# Patient Record
Sex: Female | Born: 1975
Health system: Southern US, Community
[De-identification: ages and names within clinical notes are randomized; demographics above are authoritative.]

## PROBLEM LIST (undated history)

## (undated) DIAGNOSIS — F419 Anxiety disorder, unspecified: Secondary | ICD-10-CM

## (undated) DIAGNOSIS — I10 Essential (primary) hypertension: Secondary | ICD-10-CM

## (undated) HISTORY — DX: Essential (primary) hypertension: I10

## (undated) HISTORY — DX: Anxiety disorder, unspecified: F41.9

## (undated) HISTORY — PX: LAPAROSCOPIC GASTRIC BANDING: SHX1100

---

## 2011-03-26 ENCOUNTER — Ambulatory Visit (INDEPENDENT_AMBULATORY_CARE_PROVIDER_SITE_OTHER): Payer: BC Managed Care – PPO

## 2011-03-26 DIAGNOSIS — R509 Fever, unspecified: Secondary | ICD-10-CM

## 2011-03-26 DIAGNOSIS — E669 Obesity, unspecified: Secondary | ICD-10-CM

## 2011-03-26 DIAGNOSIS — R05 Cough: Secondary | ICD-10-CM

## 2011-03-26 DIAGNOSIS — I10 Essential (primary) hypertension: Secondary | ICD-10-CM

## 2011-03-26 DIAGNOSIS — J9801 Acute bronchospasm: Secondary | ICD-10-CM

## 2011-11-29 ENCOUNTER — Other Ambulatory Visit: Payer: Self-pay | Admitting: *Deleted

## 2011-11-29 MED ORDER — HYDROCHLOROTHIAZIDE 25 MG PO TABS: 25.0000 mg | ORAL_TABLET | Freq: Every day | ORAL | Status: DC

## 2013-03-25 ENCOUNTER — Ambulatory Visit (INDEPENDENT_AMBULATORY_CARE_PROVIDER_SITE_OTHER): Payer: BC Managed Care – PPO | Admitting: Family Medicine

## 2013-03-25 VITALS — BP 130/91 | HR 82 | Temp 98.8°F | Resp 18 | Ht 65.0 in | Wt 222.8 lb

## 2013-03-25 DIAGNOSIS — F411 Generalized anxiety disorder: Secondary | ICD-10-CM

## 2013-03-25 DIAGNOSIS — F418 Other specified anxiety disorders: Secondary | ICD-10-CM

## 2013-03-25 DIAGNOSIS — I10 Essential (primary) hypertension: Secondary | ICD-10-CM

## 2013-03-25 DIAGNOSIS — R002 Palpitations: Secondary | ICD-10-CM

## 2013-03-25 DIAGNOSIS — R42 Dizziness and giddiness: Secondary | ICD-10-CM

## 2013-03-25 LAB — POCT CBC
Lymph, poc: 2.6 (ref 0.6–3.4)
MCHC: 29.9 g/dL — AB (ref 31.8–35.4)
MPV: 8.4 fL (ref 0–99.8)
POC Granulocyte: 3 (ref 2–6.9)
POC LYMPH PERCENT: 43.9 %L (ref 10–50)
POC MID %: 6 %M (ref 0–12)
RDW, POC: 13.9 %

## 2013-03-25 MED ORDER — HYDROCHLOROTHIAZIDE 12.5 MG PO CAPS: 12.5000 mg | ORAL_CAPSULE | Freq: Every day | ORAL | Status: DC

## 2013-03-25 MED ORDER — CLONAZEPAM 0.5 MG PO TABS: 0.5000 mg | ORAL_TABLET | Freq: Two times a day (BID) | ORAL | Status: DC | PRN

## 2013-03-25 NOTE — Progress Notes (Addendum)
Subjective:    Patient ID: Sandy [name], female    DOB: 12-12-1975, 37 y.o.   MRN: [id number] This chart was scribed for Meredith Staggers, MD by Danella Maiers, ED Scribe. This patient was seen in room 13 and the patient's care was started at 5:55 PM.  Chief Complaint  Patient presents with  . Dizziness    3 days  . Tachycardia  . Emesis    once    HPI HPI Comments: Sandy [name] is a 37 y.o. female who presents to the [facility] complaining of dizziness and racing heart for the last 3 days and one episode of emesis. She states she felt like she was having a panic attack but it has not stopped so she became concerned. She reports trouble sleeping and waking up in the middle of the night with racing heart and anxiety. She states her heart is beating faster, not harder. She reports some relief with putting ice packs on the back of her neck. She used to drink a lot of caffeine but has given up caffeine, tea, chocolate. She states she may not have been drinking enough fluids lately. She lives in [place] and she has some family health issues and her mother had a break-in, causing pt lots of recent stress. She denies SI, CP, SOB, LOC. She has felt stressed in the past but never as bad as recently. She denies h/o anxiety or depression. She also reports mild frontal headache and states it is probably due to chronic sinus infections. She states her dad died of heart disease at age 52 and had triple bypass at 6 but may have been having heart problems up to 10 years prior to death. He also had PVD but no MI. She states her mom has heart disease onset age 42 and an arhythmia.   She is also requesting a medication refill on HCTZ 25 mg per day for HTN. She takes her BP at home.  She states her numbers vary but have been in the 120s-130s and 80s-90s on and off of medication. She has been taking her medication only 3-4 times per week. - took in past for swelling as well.   LNMP  normal 03/08/13 - no unprotected intercourse since.   There are no active problems to display for this patient.  Past Medical History  Diagnosis Date  . Hypertension    Past Surgical History  Procedure Laterality Date  . Laparoscopic gastric banding     Allergies  Allergen Reactions  . Ace Inhibitors    Prior to Admission medications   Medication Sig Start Date End Date Taking? Authorizing Provider  hydrochlorothiazide (HYDRODIURIL) 25 MG tablet Take 1 tablet (25 mg total) by mouth daily. 11/29/11 11/28/12  Sondra Barges, PA-C   History  Substance Use Topics  . Smoking status: Never Smoker   . Smokeless tobacco: Not on file  . Alcohol Use: Yes     Review of Systems  Constitutional: Negative for fatigue and unexpected weight change.  Respiratory: Negative for chest tightness and shortness of breath.   Cardiovascular: Positive for palpitations. Negative for chest pain and leg swelling.  Gastrointestinal: Positive for vomiting. Negative for abdominal pain and blood in stool.  Neurological: Positive for dizziness and headaches. Negative for syncope and light-headedness.  Psychiatric/Behavioral: Negative for suicidal ideas.       Objective:   Physical Exam  Nursing note and vitals reviewed. Constitutional: She is oriented to person, place, and time. She appears  well-developed and well-nourished. No distress.  HENT:  Head: Normocephalic and atraumatic.  Eyes: Conjunctivae and EOM are normal. Pupils are equal, round, and reactive to light.  Neck: Neck supple. Carotid bruit is not present. No tracheal deviation present.  Cardiovascular: Normal rate, regular rhythm, normal heart sounds and intact distal pulses.   No extrasystoles are present.  Pulmonary/Chest: Effort normal and breath sounds normal. No respiratory distress.  Abdominal: Soft. She exhibits no pulsatile midline mass. There is no tenderness.  Musculoskeletal: Normal range of motion.  Neurological: She is alert and  oriented to person, place, and time.  Skin: Skin is warm and dry.  Psychiatric: She has a normal mood and affect. Her behavior is normal.     Filed Vitals:   03/25/13 1717  BP: 140/92  Pulse: 88  Temp: 98.8 F (37.1 C)  Resp: 18  Height: 5\' 5"  (1.651 m)  Weight: 222 lb 12.8 oz (101.061 kg)  SpO2: 99%   EKG: SR, no ectopy, no acute findings.   Results for orders placed in visit on 03/25/13  POCT CBC      Result Value Range   WBC 6.0  4.6 - 10.2 K/uL   Lymph, poc 2.6  0.6 - 3.4   POC LYMPH PERCENT 43.9  10 - 50 %L   MID (cbc) 0.4  0 - 0.9   POC MID % 6.0  0 - 12 %M   POC Granulocyte 3.0  2 - 6.9   Granulocyte percent 50.1  37 - 80 %G   RBC 4.64  4.04 - 5.48 M/uL   Hemoglobin 12.3  12.2 - 16.2 g/dL   HCT, POC 91.4  78.2 - 47.9 %   MCV 88.9  80 - 97 fL   MCH, POC 26.5 (*) 27 - 31.2 pg   MCHC 29.9 (*) 31.8 - 35.4 g/dL   RDW, POC 95.6     Platelet Count, POC 312  142 - 424 K/uL   MPV 8.4  0 - 99.8 fL  GLUCOSE, POCT (MANUAL RESULT ENTRY)      Result Value Range   POC Glucose 85  70 - 99 mg/dl       Assessment & Plan:   Sandy [name] is a 37 y.o. female Hypertension - Plan: EKG 12-Lead, Basic metabolic panel, hydrochlorothiazide (MICROZIDE) 12.5 MG capsule. Refilled HCTZ - can try 1/2 prior dose QD as intermittent dosing prior. rtc precautions. Plan on recheck in 6 months for CPE if controlled   Palpitations, Dizziness  - Plan: EKG 12-Lead, Basic metabolic panel, POCT glucose (manual entry),  Situational anxiety - Plan: clonazePAM (KLONOPIN) 0.5 MG tablet  - suspect stress/situational anxiety as primary cause but will check TSH and electrolytes as above. Stress and relaxation techniques discussed and Klonopin provided if needed if flare in symptoms. Return here or ER if problems worsen. Additionally recommended increased fluid intake to lessen as a contributor.    Meds ordered this encounter  Medications  . hydrochlorothiazide (MICROZIDE) 12.5 MG capsule    Sig:  Take 1 capsule (12.5 mg total) by mouth daily.    Dispense:  90 capsule    Refill:  1  . clonazePAM (KLONOPIN) 0.5 MG tablet    Sig: Take 1-2 tablets (0.5-1 mg total) by mouth 2 (two) times daily as needed for anxiety.    Dispense:  20 tablet    Refill:  1   Patient Instructions  Continue work on Brewing technologist, stress management techniques. Klonopin if needed - this can  cause sedation. You should receive a call or letter about your lab results within the next week to 10 days.  Restart hctz at 12.5mg  each day. Keep a record of your blood pressures outside of the office and bring them to the next office visit. Return to the clinic or go to the nearest emergency room if any of your symptoms worsen or new symptoms occur. Palpitations  A palpitation is the feeling that your heartbeat is irregular or is faster than normal. It may feel like your heart is fluttering or skipping a beat. Palpitations are usually not a serious problem. However, in some cases, you may need further medical evaluation. CAUSES  Palpitations can be caused by:  Smoking.  Caffeine or other stimulants, such as diet pills or energy drinks.  Alcohol.  Stress and anxiety.  Strenuous physical activity.  Fatigue.  Certain medicines.  Heart disease, especially if you have a history of arrhythmias. This includes atrial fibrillation, atrial flutter, or supraventricular tachycardia.  An improperly working pacemaker or defibrillator. DIAGNOSIS  To find the cause of your palpitations, your caregiver will take your history and perform a physical exam. Tests may also be done, including:  Electrocardiography (ECG). This test records the heart's electrical activity.  Cardiac monitoring. This allows your caregiver to monitor your heart rate and rhythm in real time.  Holter monitor. This is a portable device that records your heartbeat and can help diagnose heart arrhythmias. It allows your caregiver to track your heart  activity for several days, if needed.  Stress tests by exercise or by giving medicine that makes the heart beat faster. TREATMENT  Treatment of palpitations depends on the cause of your symptoms and can vary greatly. Most cases of palpitations do not require any treatment other than time, relaxation, and monitoring your symptoms. Other causes, such as atrial fibrillation, atrial flutter, or supraventricular tachycardia, usually require further treatment. HOME CARE INSTRUCTIONS   Avoid:  Caffeinated coffee, tea, soft drinks, diet pills, and energy drinks.  Chocolate.  Alcohol.  Stop smoking if you smoke.  Reduce your stress and anxiety. Things that can help you relax include:  A method that measures bodily functions so you can learn to control them (biofeedback).  Yoga.  Meditation.  Physical activity such as swimming, jogging, or walking.  Get plenty of rest and sleep. SEEK MEDICAL CARE IF:   You continue to have a fast or irregular heartbeat beyond 24 hours.  Your palpitations occur more often. SEEK IMMEDIATE MEDICAL CARE IF:  You develop chest pain or shortness of breath.  You have a severe headache.  You feel dizzy, or you faint. MAKE SURE YOU:  Understand these instructions.  Will watch your condition.  Will get help right away if you are not doing well or get worse. Document Released: 03/31/2000 Document Revised: 07/29/2012 Document Reviewed: 06/02/2011 [facility] Patient Information 2014 [place], [place].   I personally performed the services described in this documentation, which was scribed in my presence. The recorded information has been reviewed and considered, and addended by me as needed.

## 2013-03-25 NOTE — Patient Instructions (Signed)
Continue work on Brewing technologist, Medical illustrator. Klonopin if needed - this can cause sedation. You should receive a call or letter about your lab results within the next week to 10 days.  Restart hctz at 12.5mg  each day. Keep a record of your blood pressures outside of the office and bring them to the next office visit. Return to the clinic or go to the nearest emergency room if any of your symptoms worsen or new symptoms occur. Palpitations  A palpitation is the feeling that your heartbeat is irregular or is faster than normal. It may feel like your heart is fluttering or skipping a beat. Palpitations are usually not a serious problem. However, in some cases, you may need further medical evaluation. CAUSES  Palpitations can be caused by:  Smoking.  Caffeine or other stimulants, such as diet pills or energy drinks.  Alcohol.  Stress and anxiety.  Strenuous physical activity.  Fatigue.  Certain medicines.  Heart disease, especially if you have a history of arrhythmias. This includes atrial fibrillation, atrial flutter, or supraventricular tachycardia.  An improperly working pacemaker or defibrillator. DIAGNOSIS  To find the cause of your palpitations, your caregiver will take your history and perform a physical exam. Tests may also be done, including:  Electrocardiography (ECG). This test records the heart's electrical activity.  Cardiac monitoring. This allows your caregiver to monitor your heart rate and rhythm in real time.  Holter monitor. This is a portable device that records your heartbeat and can help diagnose heart arrhythmias. It allows your caregiver to track your heart activity for several days, if needed.  Stress tests by exercise or by giving medicine that makes the heart beat faster. TREATMENT  Treatment of palpitations depends on the cause of your symptoms and can vary greatly. Most cases of palpitations do not require any treatment other than  time, relaxation, and monitoring your symptoms. Other causes, such as atrial fibrillation, atrial flutter, or supraventricular tachycardia, usually require further treatment. HOME CARE INSTRUCTIONS   Avoid:  Caffeinated coffee, tea, soft drinks, diet pills, and energy drinks.  Chocolate.  Alcohol.  Stop smoking if you smoke.  Reduce your stress and anxiety. Things that can help you relax include:  A method that measures bodily functions so you can learn to control them (biofeedback).  Yoga.  Meditation.  Physical activity such as swimming, jogging, or walking.  Get plenty of rest and sleep. SEEK MEDICAL CARE IF:   You continue to have a fast or irregular heartbeat beyond 24 hours.  Your palpitations occur more often. SEEK IMMEDIATE MEDICAL CARE IF:  You develop chest pain or shortness of breath.  You have a severe headache.  You feel dizzy, or you faint. MAKE SURE YOU:  Understand these instructions.  Will watch your condition.  Will get help right away if you are not doing well or get worse. Document Released: 03/31/2000 Document Revised: 07/29/2012 Document Reviewed: 06/02/2011 [facility] Patient Information 2014 [place], [place].

## 2013-03-26 LAB — TSH: TSH: 1.575 u[IU]/mL (ref 0.350–4.500)

## 2013-03-26 LAB — BASIC METABOLIC PANEL
Calcium: 9.2 mg/dL (ref 8.4–10.5)
Glucose, Bld: 82 mg/dL (ref 70–99)
Sodium: 133 mEq/L — ABNORMAL LOW (ref 135–145)

## 2013-04-01 NOTE — Progress Notes (Signed)
Left message for patient to call back regarding scheduling physical with dr Neva Seat in six month.

## 2013-04-26 ENCOUNTER — Emergency Department (HOSPITAL_COMMUNITY)
Admission: EM | Admit: 2013-04-26 | Discharge: 2013-04-26 | Disposition: A | Discharge status: Home / Self Care | Payer: BC Managed Care – PPO | Attending: Emergency Medicine | Admitting: Emergency Medicine

## 2013-04-26 ENCOUNTER — Encounter (HOSPITAL_COMMUNITY): Payer: Self-pay | Admitting: Emergency Medicine

## 2013-04-26 ENCOUNTER — Emergency Department (HOSPITAL_COMMUNITY): Payer: BC Managed Care – PPO

## 2013-04-26 DIAGNOSIS — Z3202 Encounter for pregnancy test, result negative: Secondary | ICD-10-CM | POA: Insufficient documentation

## 2013-04-26 DIAGNOSIS — R143 Flatulence: Secondary | ICD-10-CM

## 2013-04-26 DIAGNOSIS — Z79899 Other long term (current) drug therapy: Secondary | ICD-10-CM | POA: Insufficient documentation

## 2013-04-26 DIAGNOSIS — R110 Nausea: Secondary | ICD-10-CM | POA: Insufficient documentation

## 2013-04-26 DIAGNOSIS — R142 Eructation: Secondary | ICD-10-CM | POA: Insufficient documentation

## 2013-04-26 DIAGNOSIS — Z9884 Bariatric surgery status: Secondary | ICD-10-CM | POA: Insufficient documentation

## 2013-04-26 DIAGNOSIS — R141 Gas pain: Secondary | ICD-10-CM | POA: Insufficient documentation

## 2013-04-26 DIAGNOSIS — K5900 Constipation, unspecified: Secondary | ICD-10-CM | POA: Insufficient documentation

## 2013-04-26 DIAGNOSIS — R109 Unspecified abdominal pain: Secondary | ICD-10-CM | POA: Insufficient documentation

## 2013-04-26 DIAGNOSIS — I10 Essential (primary) hypertension: Secondary | ICD-10-CM | POA: Insufficient documentation

## 2013-04-26 LAB — URINALYSIS, ROUTINE W REFLEX MICROSCOPIC
Bilirubin Urine: NEGATIVE
Glucose, UA: NEGATIVE mg/dL
Hgb urine dipstick: NEGATIVE
Ketones, ur: NEGATIVE mg/dL
Leukocytes, UA: NEGATIVE
Nitrite: NEGATIVE
Protein, ur: NEGATIVE mg/dL
Specific Gravity, Urine: 1.005 (ref 1.005–1.030)
Urobilinogen, UA: 0.2 mg/dL (ref 0.0–1.0)
pH: 6 (ref 5.0–8.0)

## 2013-04-26 LAB — CBC WITH DIFFERENTIAL/PLATELET
Basophils Absolute: 0 10*3/uL (ref 0.0–0.1)
Basophils Relative: 0 % (ref 0–1)
Eosinophils Absolute: 0.1 10*3/uL (ref 0.0–0.7)
Eosinophils Relative: 2 % (ref 0–5)
HCT: 40 % (ref 36.0–46.0)
Hemoglobin: 13.2 g/dL (ref 12.0–15.0)
Lymphocytes Relative: 32 % (ref 12–46)
Lymphs Abs: 1.6 10*3/uL (ref 0.7–4.0)
MCH: 27.7 pg (ref 26.0–34.0)
MCHC: 33 g/dL (ref 30.0–36.0)
MCV: 83.9 fL (ref 78.0–100.0)
Monocytes Absolute: 0.4 10*3/uL (ref 0.1–1.0)
Monocytes Relative: 9 % (ref 3–12)
Neutro Abs: 2.8 10*3/uL (ref 1.7–7.7)
Neutrophils Relative %: 57 % (ref 43–77)
Platelets: 323 10*3/uL (ref 150–400)
RBC: 4.77 MIL/uL (ref 3.87–5.11)
RDW: 12.9 % (ref 11.5–15.5)
WBC: 4.9 10*3/uL (ref 4.0–10.5)

## 2013-04-26 LAB — COMPREHENSIVE METABOLIC PANEL
ALT: 15 U/L (ref 0–35)
AST: 20 U/L (ref 0–37)
Albumin: 4 g/dL (ref 3.5–5.2)
Alkaline Phosphatase: 68 U/L (ref 39–117)
BILIRUBIN TOTAL: 1.4 mg/dL — AB (ref 0.3–1.2)
BUN: 9 mg/dL (ref 6–23)
CO2: 24 meq/L (ref 19–32)
CREATININE: 0.8 mg/dL (ref 0.50–1.10)
Calcium: 9.3 mg/dL (ref 8.4–10.5)
Chloride: 96 mEq/L (ref 96–112)
GFR calc Af Amer: >90 mL/min (ref >90)
Glucose, Bld: 98 mg/dL (ref 70–99)
Potassium: 3.8 mEq/L (ref 3.7–5.3)
Sodium: 134 mEq/L — ABNORMAL LOW (ref 137–147)
Total Protein: 7.5 g/dL (ref 6.0–8.3)

## 2013-04-26 LAB — LIPASE, BLOOD: Lipase: 28 U/L (ref 11–59)

## 2013-04-26 LAB — POCT PREGNANCY, URINE: Preg Test, Ur: NEGATIVE

## 2013-04-26 MED ORDER — PANTOPRAZOLE SODIUM 20 MG PO TBEC: 20.0000 mg | DELAYED_RELEASE_TABLET | Freq: Every day | ORAL | Status: DC

## 2013-04-26 MED ORDER — MORPHINE SULFATE 4 MG/ML IJ SOLN
4.0000 mg | Freq: Once | INTRAMUSCULAR | Status: AC
  Administered 2013-04-26: 4 mg via INTRAVENOUS
  Filled 2013-04-26: qty 1

## 2013-04-26 NOTE — ED Notes (Signed)
Bed: WA01 Expected date:  Expected time:  Means of arrival:  Comments: Closed until 11

## 2013-04-26 NOTE — Discharge Instructions (Signed)
Abdominal Pain, Women °Abdominal (stomach, pelvic, or belly) pain can be caused by many things. It is important to tell your doctor: °· The location of the pain. °· Does it come and go or is it present all the time? °· Are there things that start the pain (eating certain foods, exercise)? °· Are there other symptoms associated with the pain (fever, nausea, vomiting, diarrhea)? °All of this is helpful to know when trying to find the cause of the pain. °CAUSES  °· Stomach: virus or bacteria infection, or ulcer. °· Intestine: appendicitis (inflamed appendix), regional ileitis (Crohn's disease), ulcerative colitis (inflamed colon), irritable bowel syndrome, diverticulitis (inflamed diverticulum of the colon), or cancer of the stomach or intestine. °· Gallbladder disease or stones in the gallbladder. °· Kidney disease, kidney stones, or infection. °· Pancreas infection or cancer. °· Fibromyalgia (pain disorder). °· Diseases of the female organs: °· Uterus: fibroid (non-cancerous) tumors or infection. °· Fallopian tubes: infection or tubal pregnancy. °· Ovary: cysts or tumors. °· Pelvic adhesions (scar tissue). °· Endometriosis (uterus lining tissue growing in the pelvis and on the pelvic organs). °· Pelvic congestion syndrome (female organs filling up with blood just before the menstrual period). °· Pain with the menstrual period. °· Pain with ovulation (producing an egg). °· Pain with an IUD (intrauterine device, birth control) in the uterus. °· Cancer of the female organs. °· Functional pain (pain not caused by a disease, may improve without treatment). °· Psychological pain. °· Depression. °DIAGNOSIS  °Your doctor will decide the seriousness of your pain by doing an examination. °· Blood tests. °· X-rays. °· Ultrasound. °· CT scan (computed tomography, special type of X-ray). °· MRI (magnetic resonance imaging). °· Cultures, for infection. °· Barium enema (dye inserted in the large intestine, to better view it with  X-rays). °· Colonoscopy (looking in intestine with a lighted tube). °· Laparoscopy (minor surgery, looking in abdomen with a lighted tube). °· Major abdominal exploratory surgery (looking in abdomen with a large incision). °TREATMENT  °The treatment will depend on the cause of the pain.  °· Many cases can be observed and treated at home. °· Over-the-counter medicines recommended by your caregiver. °· Prescription medicine. °· Antibiotics, for infection. °· Birth control pills, for painful periods or for ovulation pain. °· Hormone treatment, for endometriosis. °· Nerve blocking injections. °· Physical therapy. °· Antidepressants. °· Counseling with a psychologist or psychiatrist. °· Minor or major surgery. °HOME CARE INSTRUCTIONS  °· Do not take laxatives, unless directed by your caregiver. °· Take over-the-counter pain medicine only if ordered by your caregiver. Do not take aspirin because it can cause an upset stomach or bleeding. °· Try a clear liquid diet (broth or water) as ordered by your caregiver. Slowly move to a bland diet, as tolerated, if the pain is related to the stomach or intestine. °· Have a thermometer and take your temperature several times a day, and record it. °· Bed rest and sleep, if it helps the pain. °· Avoid sexual intercourse, if it causes pain. °· Avoid stressful situations. °· Keep your follow-up appointments and tests, as your caregiver orders. °· If the pain does not go away with medicine or surgery, you may try: °· Acupuncture. °· Relaxation exercises (yoga, meditation). °· Group therapy. °· Counseling. °SEEK MEDICAL CARE IF:  °· You notice certain foods cause stomach pain. °· Your home care treatment is not helping your pain. °· You need stronger pain medicine. °· You want your IUD removed. °· You feel faint or   lightheaded. °· You develop nausea and vomiting. °· You develop a rash. °· You are having side effects or an allergy to your medicine. °SEEK IMMEDIATE MEDICAL CARE IF:  °· Your  pain does not go away or gets worse. °· You have a fever. °· Your pain is felt only in portions of the abdomen. The right side could possibly be appendicitis. The left lower portion of the abdomen could be colitis or diverticulitis. °· You are passing blood in your stools (bright red or black tarry stools, with or without vomiting). °· You have blood in your urine. °· You develop chills, with or without a fever. °· You pass out. °MAKE SURE YOU:  °· Understand these instructions. °· Will watch your condition. °· Will get help right away if you are not doing well or get worse. °Document Released: 01/29/2007 Document Revised: 06/26/2011 Document Reviewed: 02/18/2009 °[employer]® Patient Information ©2014 [employer]. ° °

## 2013-04-26 NOTE — ED Notes (Signed)
States that she began having abd pain and right side pain this am. States that her last BM was 3 days ago and took a laxative with result this am. States that she still has abd discomfort. Has lapband

## 2013-04-26 NOTE — ED Provider Notes (Signed)
CSN: [id number]     Arrival date & time 04/26/13  0944 History   First MD Initiated Contact with Patient 04/26/13 1139     Chief Complaint  Patient presents with  . Abdominal Pain  . Constipation   (Consider location/radiation/quality/duration/timing/severity/associated sxs/prior Treatment) Patient is a 38 y.o. female presenting with abdominal pain. The history is provided by the patient. No language interpreter was used.  Abdominal Pain Pain location:  RUQ Pain quality: aching and sharp   Pain radiates to:  Does not radiate Pain severity:  Moderate Duration:  1 week Timing:  Constant Progression:  Waxing and waning Chronicity:  New Relieved by:  Flatus and belching Worsened by:  Nothing tried Ineffective treatments:  None tried Associated symptoms: constipation, flatus and nausea   Associated symptoms: no chest pain, no chills, no cough, no diarrhea, no dysuria, no fatigue, no fever, no shortness of breath, no sore throat and no vomiting   Risk factors: obesity   Risk factors comment:  Lap band   Past Medical History  Diagnosis Date  . Hypertension    Past Surgical History  Procedure Laterality Date  . Laparoscopic gastric banding     Family History  Problem Relation Age of Onset  . Hypertension Mother   . Heart disease Mother   . Diabetes Mother   . Hypertension Father   . Heart disease Father    History  Substance Use Topics  . Smoking status: Never Smoker   . Smokeless tobacco: Not on file  . Alcohol Use: Yes   OB History   Grav Para Term Preterm Abortions TAB SAB Ect Mult Living                 Review of Systems  Constitutional: Negative for fever, chills, diaphoresis, activity change, appetite change and fatigue.  HENT: Negative for congestion, facial swelling, rhinorrhea and sore throat.   Eyes: Negative for photophobia and discharge.  Respiratory: Negative for cough, chest tightness and shortness of breath.   Cardiovascular: Negative for chest pain,  palpitations and leg swelling.  Gastrointestinal: Positive for nausea, abdominal pain, constipation and flatus. Negative for vomiting and diarrhea.  Endocrine: Negative for polydipsia and polyuria.  Genitourinary: Negative for dysuria, frequency, difficulty urinating and pelvic pain.  Musculoskeletal: Negative for arthralgias, back pain, neck pain and neck stiffness.  Skin: Negative for color change and wound.  Allergic/Immunologic: Negative for immunocompromised state.  Neurological: Negative for facial asymmetry, weakness, numbness and headaches.  Hematological: Does not bruise/bleed easily.  Psychiatric/Behavioral: Negative for confusion and agitation.    Allergies  Ace inhibitors  Home Medications   Current Outpatient Rx  Name  Route  Sig  Dispense  Refill  . b complex vitamins tablet   Oral   Take 1 tablet by mouth daily.         . clonazePAM (KLONOPIN) 0.5 MG tablet   Oral   Take 1-2 tablets (0.5-1 mg total) by mouth 2 (two) times daily as needed for anxiety.   20 tablet   1   . hydrochlorothiazide (MICROZIDE) 12.5 MG capsule   Oral   Take 1 capsule (12.5 mg total) by mouth daily.   90 capsule   1   . pantoprazole (PROTONIX) 20 MG tablet   Oral   Take 1 tablet (20 mg total) by mouth daily.   30 tablet   0    BP 149/102  Pulse 81  Temp(Src) 98.7 F (37.1 C) (Oral)  Resp 17  SpO2 100%  LMP 04/02/2013 Physical Exam  Constitutional: She is oriented to person, place, and time. She appears well-developed and well-nourished. No distress.  HENT:  Head: Normocephalic and atraumatic.  Mouth/Throat: No oropharyngeal exudate.  Eyes: Pupils are equal, round, and reactive to light.  Neck: Normal range of motion. Neck supple.  Cardiovascular: Normal rate, regular rhythm and normal heart sounds.  Exam reveals no gallop and no friction rub.   No murmur heard. Pulmonary/Chest: Effort normal and breath sounds normal. No respiratory distress. She has no wheezes. She has  no rales.  Abdominal: Soft. Bowel sounds are normal. She exhibits no distension and no mass. There is tenderness in the right upper quadrant and epigastric area. There is no rigidity, no rebound and no guarding.  Musculoskeletal: Normal range of motion. She exhibits no edema and no tenderness.  Neurological: She is alert and oriented to person, place, and time.  Skin: Skin is warm and dry.  Psychiatric: She has a normal mood and affect.    ED Course  Procedures (including critical care time) Labs Review Labs Reviewed  COMPREHENSIVE METABOLIC PANEL - Abnormal; Notable for the following:    Sodium 134 (*)    Total Bilirubin 1.4 (*)    All other components within normal limits  URINALYSIS, ROUTINE W REFLEX MICROSCOPIC - Abnormal; Notable for the following:    APPearance CLOUDY (*)    All other components within normal limits  CBC WITH DIFFERENTIAL  LIPASE, BLOOD  POCT PREGNANCY, URINE   Imaging Review [place] Abdomen Complete  04/26/2013   CLINICAL DATA:  38 year old female with abdominal pain.  EXAM: ULTRASOUND ABDOMEN COMPLETE  COMPARISON:  None.  FINDINGS: Gallbladder:  The gallbladder is unremarkable. There is no evidence of cholelithiasis or cholecystitis.  Common bile duct:  Diameter: The visualized CBD is unremarkable measuring 4.5 mm. There is no evidence of intrahepatic or extrahepatic biliary dilatation.  Liver:  No focal lesion identified. Within normal limits in parenchymal echogenicity.  IVC:  No abnormality noted but the intrahepatic IVC is difficult to visualize.  Pancreas:  Not well visualized secondary to bowel gas.  Spleen:  Size and appearance within normal limits.  Right Kidney:  Length: 9.7 cm. Echogenicity within normal limits. No mass or hydronephrosis visualized. A 4 mm calculus within the mid kidney is noted.  Left Kidney:  Length: 10.8 cm. Echogenicity within normal limits. No mass or hydronephrosis visualized. A 4 mm calculus within the mid kidney is noted.  Abdominal  aorta:  No aneurysm visualized.  Other findings:  There is no evidence of ascites.  IMPRESSION: No acute abnormalities.  Unremarkable gallbladder.  Bilateral nonobstructing renal calculi.  Portions of the IVC and pancreas not well visualized secondary to overlying bowel gas.   Electronically Signed   By: Laveda AbbeJeff  Hu M.D.   On: 04/26/2013 13:29    EKG Interpretation   None       MDM   1. Abdominal pain    Pt is a 38 y.o. female with Pmhx as above who presents with about 1 week of RUQ pain, constant, w/o radiation, improved w/ belching or passing flatus. She has also had 2-3 days of constipation, but had BM this am after taking 3 OTC "colon cleanse" pills. On PE, VSS, pt in NAD. +RUQ, epigastric ttp w/o rebound or guarding.  CBC, CMP, lipase, UA unremarkable except for Na 134.  US abdomen added which was unremarkable.  Pt feeling improved after 1 dose of morphine.  I doubt acute surgical emergency such as  SBO, appendicitis, cholecystitis and feel constipation or GERD more likely cause of symptoms.  Will start trial of PPI, have asked her to continue bowel regimen at home and f/u with [facility] early next week.  Return precautions given for new or worsening symptoms including worsening pain, fever, inability to tolerate PO.          Shanna Cisco, MD 04/26/13 1356

## 2013-04-26 NOTE — ED Notes (Signed)
Ultrasound at bedside

## 2013-08-19 ENCOUNTER — Ambulatory Visit (INDEPENDENT_AMBULATORY_CARE_PROVIDER_SITE_OTHER): Payer: BC Managed Care – PPO | Admitting: Physician Assistant

## 2013-08-19 VITALS — BP 120/72 | HR 78 | Temp 98.7°F | Resp 14 | Ht 65.0 in | Wt 227.0 lb

## 2013-08-19 DIAGNOSIS — H6980 Other specified disorders of Eustachian tube, unspecified ear: Secondary | ICD-10-CM

## 2013-08-19 DIAGNOSIS — H6990 Unspecified Eustachian tube disorder, unspecified ear: Secondary | ICD-10-CM

## 2013-08-19 DIAGNOSIS — J329 Chronic sinusitis, unspecified: Secondary | ICD-10-CM

## 2013-08-19 DIAGNOSIS — R51 Headache: Secondary | ICD-10-CM

## 2013-08-19 LAB — POCT CBC
Granulocyte percent: 50.1 %G (ref 37–80)
HCT, POC: 38.5 % (ref 37.7–47.9)
Hemoglobin: 12.1 g/dL — AB (ref 12.2–16.2)
Lymph, poc: 2.7 (ref 0.6–3.4)
MCH, POC: 26.6 pg — AB (ref 27–31.2)
MCHC: 31.4 g/dL — AB (ref 31.8–35.4)
MCV: 84.6 fL (ref 80–97)
MID (cbc): 0.5 (ref 0–0.9)
MPV: 7.7 fL (ref 0–99.8)
POC Granulocyte: 3.2 (ref 2–6.9)
POC LYMPH PERCENT: 41.8 %L (ref 10–50)
POC MID %: 8.1 %M (ref 0–12)
Platelet Count, POC: 393 10*3/uL (ref 142–424)
RBC: 4.55 M/uL (ref 4.04–5.48)
RDW, POC: 14.4 %
WBC: 6.4 10*3/uL (ref 4.6–10.2)

## 2013-08-19 MED ORDER — FLUTICASONE PROPIONATE 50 MCG/ACT NA SUSP: 2.0000 | Freq: Every day | NASAL | Status: DC

## 2013-08-19 MED ORDER — AMOXICILLIN-POT CLAVULANATE 875-125 MG PO TABS: 1.0000 | ORAL_TABLET | Freq: Two times a day (BID) | ORAL | Status: DC

## 2013-08-19 MED ORDER — KETOROLAC TROMETHAMINE 60 MG/2ML IM SOLN
60.0000 mg | Freq: Once | INTRAMUSCULAR | Status: AC
  Administered 2013-08-19: 60 mg via INTRAMUSCULAR

## 2013-08-19 NOTE — Progress Notes (Signed)
Subjective:    Patient ID: Sandy SorrowJessica Brosnahan, female    DOB: August 03, 1975, 38 y.o.   MRN: [id number]  HPI 38 year old female presents for evaluation of 5 day history of left sided facial pain that has now developed into left ear pain as well. Has slight nasal congestion, PND, and sore throat. Has hx of sinus infections in the past but not usually associated with otalgia.  Does report hx of toothache that was later dx as a sinus infection. Pain is in left maxillary area and left ear.  Has had slight "unsteadiness" but no nausea, vomiting, vision changes, or syncope.  Has taken ibuprofen 600 mg which "took the edge off" but did not relieve her headache.  Hx of migraines usually accompanied by photophobia and nausea.  Patient is otherwise healthy with no other concerns today.     Review of Systems  Constitutional: Positive for chills. Negative for fever.  HENT: Positive for ear pain (left), postnasal drip, rhinorrhea, sinus pressure and sore throat.   Respiratory: Negative for cough.   Gastrointestinal: Negative for nausea and vomiting.  Neurological: Positive for dizziness and headaches.       Objective:   Physical Exam  Constitutional: She is oriented to person, place, and time. She appears well-developed and well-nourished.  HENT:  Head: Normocephalic and atraumatic.  Right Ear: Hearing, tympanic membrane, external ear and ear canal normal.  Left Ear: Hearing, tympanic membrane, external ear and ear canal normal.  Nose: Right sinus exhibits no maxillary sinus tenderness and no frontal sinus tenderness. Left sinus exhibits maxillary sinus tenderness. Left sinus exhibits no frontal sinus tenderness.  Mouth/Throat: Uvula is midline and mucous membranes are normal. Posterior oropharyngeal erythema present. No oropharyngeal exudate, posterior oropharyngeal edema or tonsillar abscesses.  Eyes: Conjunctivae are normal.  Neck: Normal range of motion. Neck supple.  Cardiovascular: Normal rate,  regular rhythm and normal heart sounds.   Pulmonary/Chest: Effort normal and breath sounds normal.  Lymphadenopathy:    She has no cervical adenopathy.  Neurological: She is alert and oriented to person, place, and time.  Psychiatric: She has a normal mood and affect. Her behavior is normal. Judgment and thought content normal.      Results for orders placed in visit on 08/19/13  POCT CBC      Result Value Ref Range   WBC 6.4  4.6 - 10.2 K/uL   Lymph, poc 2.7  0.6 - 3.4   POC LYMPH PERCENT 41.8  10 - 50 %L   MID (cbc) 0.5  0 - 0.9   POC MID % 8.1  0 - 12 %M   POC Granulocyte 3.2  2 - 6.9   Granulocyte percent 50.1  37 - 80 %G   RBC 4.55  4.04 - 5.48 M/uL   Hemoglobin 12.1 (*) 12.2 - 16.2 g/dL   HCT, POC 40.938.5  81.137.7 - 47.9 %   MCV 84.6  80 - 97 fL   MCH, POC 26.6 (*) 27 - 31.2 pg   MCHC 31.4 (*) 31.8 - 35.4 g/dL   RDW, POC 91.414.4     Platelet Count, POC 393  142 - 424 K/uL   MPV 7.7  0 - 99.8 fL       Assessment & Plan:  Sinus infection - Plan: POCT CBC, amoxicillin-clavulanate (AUGMENTIN) 875-125 MG per tablet  Headache(784.0) - Plan: ketorolac (TORADOL) injection 60 mg  ETD (eustachian tube dysfunction) - Plan: fluticasone (FLONASE) 50 MCG/ACT nasal spray  Will treat as sinus  infection with Augmentin 875 mg bid x 10 days Flonase twice daily to help with ETD and sinusitis Toradol 60 mg IM today for headache. RTC precautions discussed If needed, may take tylenol 1000 mg tonight, otherwise, can continue ibuprofen 600-800 mg tid prn pain.  Follow up if symptoms worsen or fail to improve.

## 2013-09-30 ENCOUNTER — Ambulatory Visit (INDEPENDENT_AMBULATORY_CARE_PROVIDER_SITE_OTHER): Payer: BC Managed Care – PPO

## 2013-09-30 ENCOUNTER — Ambulatory Visit (INDEPENDENT_AMBULATORY_CARE_PROVIDER_SITE_OTHER): Payer: BC Managed Care – PPO | Admitting: Family Medicine

## 2013-09-30 VITALS — BP 118/76 | HR 73 | Temp 98.7°F | Wt 233.0 lb

## 2013-09-30 DIAGNOSIS — H6980 Other specified disorders of Eustachian tube, unspecified ear: Secondary | ICD-10-CM

## 2013-09-30 DIAGNOSIS — D62 Acute posthemorrhagic anemia: Secondary | ICD-10-CM

## 2013-09-30 DIAGNOSIS — H9209 Otalgia, unspecified ear: Secondary | ICD-10-CM

## 2013-09-30 DIAGNOSIS — R51 Headache: Secondary | ICD-10-CM

## 2013-09-30 DIAGNOSIS — R519 Headache, unspecified: Secondary | ICD-10-CM

## 2013-09-30 DIAGNOSIS — J329 Chronic sinusitis, unspecified: Secondary | ICD-10-CM

## 2013-09-30 DIAGNOSIS — I889 Nonspecific lymphadenitis, unspecified: Secondary | ICD-10-CM

## 2013-09-30 DIAGNOSIS — H9202 Otalgia, left ear: Secondary | ICD-10-CM

## 2013-09-30 LAB — POCT CBC
Granulocyte percent: 58 %G (ref 37–80)
HEMATOCRIT: 38.5 % (ref 37.7–47.9)
HEMOGLOBIN: 11.7 g/dL — AB (ref 12.2–16.2)
Lymph, poc: 2.3 (ref 0.6–3.4)
MCH, POC: 25.7 pg — AB (ref 27–31.2)
MCHC: 30.4 g/dL — AB (ref 31.8–35.4)
MCV: 84.7 fL (ref 80–97)
MID (cbc): 0.5 (ref 0–0.9)
MPV: 8.3 fL (ref 0–99.8)
PLATELET COUNT, POC: 302 10*3/uL (ref 142–424)
POC Granulocyte: 3.9 (ref 2–6.9)
POC LYMPH PERCENT: 34.5 %L (ref 10–50)
POC MID %: 7.5 %M (ref 0–12)
RBC: 4.55 M/uL (ref 4.04–5.48)
RDW, POC: 14.6 %
WBC: 6.8 10*3/uL (ref 4.6–10.2)

## 2013-09-30 MED ORDER — CEFDINIR 300 MG PO CAPS: 600.0000 mg | ORAL_CAPSULE | Freq: Every day | ORAL | Status: DC

## 2013-09-30 MED ORDER — METHYLPREDNISOLONE ACETATE 80 MG/ML IJ SUSP
120.0000 mg | Freq: Once | INTRAMUSCULAR | Status: AC
Start: 2013-09-30 — End: 2013-09-30
  Administered 2013-09-30: 120 mg via INTRAMUSCULAR

## 2013-09-30 NOTE — Progress Notes (Signed)
Subjective: 38 year old lady who was seen here last month and treated with a course of Augmentin. Refer back to those notes. She never got completely well. She is persisted with facial pain, more on the left than the right. She still feels like her ears are not doing well. Her left ear is been hurting her. It hurts to move the external ear also. She has noticed some swelling of the gland underneath the chin on the left side. She does not smoke. No definite fevers.  Objective: TMs normal the right, dull but not red or infected looking on the left. Ear canal had a tiny bit of wax. Her here mom is tender when wiggling it. Throat clear. Neck supple. Has submandibular nodes on the left which tender. Chest clear. Heart regular without murmurs.  Assessment: Probable sinusitis Lymphadenitis Otalgia Plan:   Results for orders placed in visit on 09/30/13  POCT CBC      Result Value Ref Range   WBC 6.8  4.6 - 10.2 K/uL   Lymph, poc 2.3  0.6 - 3.4   POC LYMPH PERCENT 34.5  10 - 50 %L   MID (cbc) 0.5  0 - 0.9   POC MID % 7.5  0 - 12 %M   POC Granulocyte 3.9  2 - 6.9   Granulocyte percent 58.0  37 - 80 %G   RBC 4.55  4.04 - 5.48 M/uL   Hemoglobin 11.7 (*) 12.2 - 16.2 g/dL   HCT, POC 21.338.5  08.637.7 - 47.9 %   MCV 84.7  80 - 97 fL   MCH, POC 25.7 (*) 27 - 31.2 pg   MCHC 30.4 (*) 31.8 - 35.4 g/dL   RDW, POC 57.814.6     Platelet Count, POC 302  142 - 424 K/uL   MPV 8.3  0 - 99.8 fL    [facility] reading (PRIMARY) by  Dr. Alwyn RenHopper Mild hazyness right maxillary sinus compared to left, but no thickening noted.    . no totally clear-cut diagnosis. Will treat as if this was a persistent sinusitis low-grade, which I suspect it is. She does not tolerate oral prednisone well, so we'll just treat her with a shot of Depo-Medrol. Antibiotics, Omnicef, for 10 days. If not improving may need a CT scan of her sinuses if she the same kind of symptoms.

## 2013-09-30 NOTE — Patient Instructions (Signed)
Drink plenty of fluids. Better hydrated you or the thinner the secretions.  [facility]  Continue using the Flonase  If you are not improving over the next week or 10 days and I think we would probably want to consider getting a CT scan of the sinuses.

## 2013-12-22 ENCOUNTER — Ambulatory Visit (INDEPENDENT_AMBULATORY_CARE_PROVIDER_SITE_OTHER): Payer: BC Managed Care – PPO | Admitting: Family Medicine

## 2013-12-22 ENCOUNTER — Ambulatory Visit (INDEPENDENT_AMBULATORY_CARE_PROVIDER_SITE_OTHER): Payer: BC Managed Care – PPO

## 2013-12-22 VITALS — BP 124/86 | HR 89 | Temp 99.7°F | Resp 16 | Ht 64.0 in | Wt 231.8 lb

## 2013-12-22 DIAGNOSIS — R059 Cough, unspecified: Secondary | ICD-10-CM

## 2013-12-22 DIAGNOSIS — R509 Fever, unspecified: Secondary | ICD-10-CM

## 2013-12-22 DIAGNOSIS — R079 Chest pain, unspecified: Secondary | ICD-10-CM

## 2013-12-22 DIAGNOSIS — R05 Cough: Secondary | ICD-10-CM

## 2013-12-22 DIAGNOSIS — N926 Irregular menstruation, unspecified: Secondary | ICD-10-CM

## 2013-12-22 LAB — POCT URINE PREGNANCY: Preg Test, Ur: NEGATIVE

## 2013-12-22 MED ORDER — PREDNISONE 20 MG PO TABS
40.0000 mg | ORAL_TABLET | Freq: Every day | ORAL | Status: DC
Start: 2013-12-22 — End: 2015-03-08

## 2013-12-22 MED ORDER — HYDROCODONE-HOMATROPINE 5-1.5 MG/5ML PO SYRP: 5.0000 mL | ORAL_SOLUTION | Freq: Three times a day (TID) | ORAL | Status: DC | PRN

## 2013-12-22 NOTE — Patient Instructions (Signed)
Please call me if you do not see improvement over the next 48 hours. I've ordered a strong steroid medicine to stop the pain and relieve any congestion. In addition I've ordered some cough medicine for you to also help with your symptoms.  Knee x-ray looks fine and it appears from the exam that you simply have a viral infection.

## 2013-12-22 NOTE — Progress Notes (Signed)
Subjective:  This chart was scribed for Elvina Sidle, MD by Carl Best, Medical Scribe. This patient was seen in Room 12 and the patient's care was started at 8:51 AM.   Patient ID: Sandy [name], female    DOB: 1975-10-02, 38 y.o.   MRN: [id number]  HPI HPI Comments: Sandy [name] is a 38 y.o. female who presents to the [facility] complaining of constant upper left-sided chest pain that started 8 days ago after she hit herself in the chest with a cabinet.  Coughing aggravates the pain.  She later developed a deep, dry cough.  She denies fever as an associated symptom but had a temperature of 99.7 degrees today.  She denies leg pain and SOB as associated symptoms as well.  She took Advil and applied heat and ice to her chest with no relief to her symptoms.  She has been diagnosed with asthmatic bronchitis in the past but she does not have a history of asthma.    Past Medical History  Diagnosis Date  . Hypertension    Past Surgical History  Procedure Laterality Date  . Laparoscopic gastric banding     Family History  Problem Relation Age of Onset  . Hypertension Mother   . Heart disease Mother   . Diabetes Mother   . Hypertension Father   . Heart disease Father    History   Social History  . Marital Status: Single    Spouse Name: N/A    Number of Children: N/A  . Years of Education: N/A   Occupational History  . Not on file.   Social History Main Topics  . Smoking status: Never Smoker   . Smokeless tobacco: Not on file  . Alcohol Use: Yes  . Drug Use: No  . Sexual Activity: Yes   Other Topics Concern  . Not on file   Social History Narrative  . No narrative on file   Allergies  Allergen Reactions  . Ace Inhibitors     Throat closes and lips swell    Review of Systems  Constitutional: Negative for fever.  Respiratory: Positive for cough. Negative for shortness of breath.   Cardiovascular: Positive for chest pain. Negative for leg  swelling.     Objective:  Physical Exam  Nursing note and vitals reviewed. Constitutional: She is oriented to person, place, and time. She appears well-developed and well-nourished.  HENT:  Head: Normocephalic and atraumatic.  Right Ear: External ear normal.  Left Ear: External ear normal.  Nose: Nose normal.  Mouth/Throat: Oropharynx is clear and moist. No oropharyngeal exudate.  Eyes: Conjunctivae and EOM are normal. Pupils are equal, round, and reactive to light.  Neck: Normal range of motion.  Cardiovascular: Normal rate.   Pulmonary/Chest: Effort normal. She has decreased breath sounds.  Slight decrease in breath sounds over the left upper anterior chest with tenderness to palpation.   Musculoskeletal: Normal range of motion.  Normal back exam.  No tenderness in her legs bilaterally.    Neurological: She is alert and oriented to person, place, and time.  Skin: Skin is warm and dry.  Psychiatric: She has a normal mood and affect. Her behavior is normal.   [facility] reading (PRIMARY) by  Dr. Milus Glazier:  CXR normal. Results for orders placed in visit on 12/22/13  POCT URINE PREGNANCY      Result Value Ref Range   Preg Test, Ur Negative        BP 124/86  Pulse 89  Temp(Src) 99.7 F (37.6 C) (Oral)  Resp 16  Ht  (1.626 m)  Wt 231 lb 12.8 oz (105.144 kg)  BMI 39.77 kg/m2  SpO2 99%  LMP 11/19/2013 Assessment & Plan:    I personally performed the services described in this documentation, which was scribed in my presence. The recorded information has been reviewed and is accurate.  Chest pain, unspecified chest pain type - Plan: DG Chest 2 View, predniSONE (DELTASONE) 20 MG tablet  Cough - Plan: DG Chest 2 View, predniSONE (DELTASONE) 20 MG tablet, HYDROcodone-homatropine (HYCODAN) 5-1.5 MG/5ML syrup  Fever, unspecified fever cause  Irregular menses - Plan: POCT urine pregnancy  Signed, Elvina Sidle, MD

## 2014-01-12 ENCOUNTER — Encounter: Payer: BC Managed Care – PPO | Admitting: Family Medicine

## 2014-01-24 ENCOUNTER — Other Ambulatory Visit: Payer: Self-pay | Admitting: Family Medicine

## 2014-02-17 ENCOUNTER — Other Ambulatory Visit: Payer: Self-pay | Admitting: Family Medicine

## 2014-02-18 NOTE — Telephone Encounter (Signed)
Pt has been in since her visit with you and seen different providers, but I don't see anxiety discussed since her OV w/you last Dec. Does she need to RTC for a refill, or do you want to give 1 RF w/note that she needs OV for more?

## 2014-02-18 NOTE — Telephone Encounter (Signed)
Agree with notes on Rx. Refilled once, but further refills need ov as I last saw her in 03/2013.

## 2014-02-19 NOTE — Telephone Encounter (Signed)
Notified pt on VM of RF and need for OV.

## 2014-02-19 NOTE — Telephone Encounter (Signed)
Called in w/note as written.

## 2014-03-17 ENCOUNTER — Telehealth: Payer: Self-pay

## 2014-03-17 ENCOUNTER — Ambulatory Visit (INDEPENDENT_AMBULATORY_CARE_PROVIDER_SITE_OTHER): Payer: BC Managed Care – PPO | Admitting: Family Medicine

## 2014-03-17 VITALS — BP 142/90 | HR 90 | Temp 99.0°F | Resp 18 | Ht 66.0 in | Wt 233.0 lb

## 2014-03-17 DIAGNOSIS — K6289 Other specified diseases of anus and rectum: Secondary | ICD-10-CM

## 2014-03-17 DIAGNOSIS — N760 Acute vaginitis: Secondary | ICD-10-CM

## 2014-03-17 DIAGNOSIS — A499 Bacterial infection, unspecified: Secondary | ICD-10-CM

## 2014-03-17 DIAGNOSIS — N898 Other specified noninflammatory disorders of vagina: Secondary | ICD-10-CM

## 2014-03-17 DIAGNOSIS — R102 Pelvic and perineal pain: Secondary | ICD-10-CM

## 2014-03-17 DIAGNOSIS — K644 Residual hemorrhoidal skin tags: Secondary | ICD-10-CM

## 2014-03-17 DIAGNOSIS — R8290 Unspecified abnormal findings in urine: Secondary | ICD-10-CM

## 2014-03-17 DIAGNOSIS — K648 Other hemorrhoids: Secondary | ICD-10-CM

## 2014-03-17 DIAGNOSIS — B9689 Other specified bacterial agents as the cause of diseases classified elsewhere: Secondary | ICD-10-CM

## 2014-03-17 DIAGNOSIS — Z113 Encounter for screening for infections with a predominantly sexual mode of transmission: Secondary | ICD-10-CM

## 2014-03-17 LAB — POCT UA - MICROSCOPIC ONLY
Casts, Ur, LPF, POC: NEGATIVE
Crystals, Ur, HPF, POC: NEGATIVE
Mucus, UA: NEGATIVE
Yeast, UA: NEGATIVE

## 2014-03-17 LAB — POCT WET PREP WITH KOH
Clue Cells Wet Prep HPF POC: 50
KOH Prep POC: NEGATIVE
Trichomonas, UA: NEGATIVE
Yeast Wet Prep HPF POC: NEGATIVE

## 2014-03-17 LAB — POCT URINALYSIS DIPSTICK
Bilirubin, UA: NEGATIVE
Glucose, UA: NEGATIVE
Ketones, UA: 40
Nitrite, UA: NEGATIVE
Protein, UA: NEGATIVE
Spec Grav, UA: 1.01
Urobilinogen, UA: 0.2
pH, UA: 5

## 2014-03-17 MED ORDER — HYDROCORTISONE 2.5 % RE CREA: 1.0000 "application " | TOPICAL_CREAM | Freq: Two times a day (BID) | RECTAL | Status: DC

## 2014-03-17 MED ORDER — LIDOCAINE-PRILOCAINE 2.5-2.5 % EX CREA: 1.0000 "application " | TOPICAL_CREAM | CUTANEOUS | Status: DC | PRN

## 2014-03-17 MED ORDER — DILTIAZEM GEL 2 %: 1.0000 "application " | Freq: Three times a day (TID) | CUTANEOUS | Status: DC | PRN

## 2014-03-17 MED ORDER — METRONIDAZOLE 500 MG PO TABS: 500.0000 mg | ORAL_TABLET | Freq: Two times a day (BID) | ORAL | Status: DC

## 2014-03-17 NOTE — Telephone Encounter (Signed)
[facility] on [address] called to say that they could not compound the Diltiazem but that [facility] on [address] could. Spoke with pt and she will go there to pick it up. Resent

## 2014-03-17 NOTE — Patient Instructions (Addendum)
Bacterial Vaginosis Bacterial vaginosis is a vaginal infection that occurs when the normal balance of bacteria in the vagina is disrupted. It results from an overgrowth of certain bacteria. This is the most common vaginal infection in women of childbearing age. Treatment is important to prevent complications, especially in pregnant women, as it can cause a premature delivery. CAUSES  Bacterial vaginosis is caused by an increase in harmful bacteria that are normally present in smaller amounts in the vagina. Several different kinds of bacteria can cause bacterial vaginosis. However, the reason that the condition develops is not fully understood. RISK FACTORS Certain activities or behaviors can put you at an increased risk of developing bacterial vaginosis, including:  Having a new sex partner or multiple sex partners.  Douching.  Using an intrauterine device (IUD) for contraception. Women do not get bacterial vaginosis from toilet seats, bedding, swimming pools, or contact with objects around them. SIGNS AND SYMPTOMS  Some women with bacterial vaginosis have no signs or symptoms. Common symptoms include:  Grey vaginal discharge.  A fishlike odor with discharge, especially after sexual intercourse.  Itching or burning of the vagina and vulva.  Burning or pain with urination. DIAGNOSIS  Your health care provider will take a medical history and examine the vagina for signs of bacterial vaginosis. A sample of vaginal fluid may be taken. Your health care provider will look at this sample under a microscope to check for bacteria and abnormal cells. A vaginal pH test may also be done.  TREATMENT  Bacterial vaginosis may be treated with antibiotic medicines. These may be given in the form of a pill or a vaginal cream. A second round of antibiotics may be prescribed if the condition comes back after treatment.  HOME CARE INSTRUCTIONS   Only take over-the-counter or prescription medicines as  directed by your health care provider.  If antibiotic medicine was prescribed, take it as directed. Make sure you finish it even if you start to feel better.  Do not have sex until treatment is completed.  Tell all sexual partners that you have a vaginal infection. They should see their health care provider and be treated if they have problems, such as a mild rash or itching.  Practice safe sex by using condoms and only having one sex partner. SEEK MEDICAL CARE IF:   Your symptoms are not improving after 3 days of treatment.  You have increased discharge or pain.  You have a fever. MAKE SURE YOU:   Understand these instructions.  Will watch your condition.  Will get help right away if you are not doing well or get worse. FOR MORE INFORMATION  Centers for Disease Control and Prevention, Division of STD Prevention: SolutionApps.co.zawww.cdc.gov/std American Sexual Health Association (ASHA): www.ashastd.org  Document Released: 04/03/2005 Document Revised: 01/22/2013 Document Reviewed: 11/13/2012 [facility] Patient Information 2015 [place], [place]. This information is not intended to replace advice given to you by your health care provider. Make sure you discuss any questions you have with your health care provider. Hemorrhoids Hemorrhoids are swollen veins around the rectum or anus. There are two types of hemorrhoids:   Internal hemorrhoids. These occur in the veins just inside the rectum. They may poke through to the outside and become irritated and painful.  External hemorrhoids. These occur in the veins outside the anus and can be felt as a painful swelling or hard lump near the anus. CAUSES  Pregnancy.   Obesity.   Constipation or diarrhea.   Straining to have a bowel movement.  Sitting for long periods on the toilet.  Heavy lifting or other activity that caused you to strain.  Anal intercourse. SYMPTOMS   Pain.   Anal itching or irritation.   Rectal bleeding.   Fecal  leakage.   Anal swelling.   One or more lumps around the anus.  DIAGNOSIS  Your caregiver may be able to diagnose hemorrhoids by visual examination. Other examinations or tests that may be performed include:   Examination of the rectal area with a gloved hand (digital rectal exam).   Examination of anal canal using a small tube (scope).   A blood test if you have lost a significant amount of blood.  A test to look inside the colon (sigmoidoscopy or colonoscopy). TREATMENT Most hemorrhoids can be treated at home. However, if symptoms do not seem to be getting better or if you have a lot of rectal bleeding, your caregiver may perform a procedure to help make the hemorrhoids get smaller or remove them completely. Possible treatments include:   Placing a rubber band at the base of the hemorrhoid to cut off the circulation (rubber band ligation).   Injecting a chemical to shrink the hemorrhoid (sclerotherapy).   Using a tool to burn the hemorrhoid (infrared light therapy).   Surgically removing the hemorrhoid (hemorrhoidectomy).   Stapling the hemorrhoid to block blood flow to the tissue (hemorrhoid stapling).  HOME CARE INSTRUCTIONS   Eat foods with fiber, such as whole grains, beans, nuts, fruits, and vegetables. Ask your doctor about taking products with added fiber in them (fibersupplements).  Increase fluid intake. Drink enough water and fluids to keep your urine clear or pale yellow.   Exercise regularly.   Go to the bathroom when you have the urge to have a bowel movement. Do not wait.   Avoid straining to have bowel movements.   Keep the anal area dry and clean. Use wet toilet paper or moist towelettes after a bowel movement.   Medicated creams and suppositories may be used or applied as directed.   Only take over-the-counter or prescription medicines as directed by your caregiver.   Take warm sitz baths for 15-20 minutes, 3-4 times a day to ease pain  and discomfort.   Place ice packs on the hemorrhoids if they are tender and swollen. Using ice packs between sitz baths may be helpful.   Put ice in a plastic bag.   Place a towel between your skin and the bag.   Leave the ice on for 15-20 minutes, 3-4 times a day.   Do not use a donut-shaped pillow or sit on the toilet for long periods. This increases blood pooling and pain.  SEEK MEDICAL CARE IF:  You have increasing pain and swelling that is not controlled by treatment or medicine.  You have uncontrolled bleeding.  You have difficulty or you are unable to have a bowel movement.  You have pain or inflammation outside the area of the hemorrhoids. MAKE SURE YOU:  Understand these instructions.  Will watch your condition.  Will get help right away if you are not doing well or get worse. Document Released: 03/31/2000 Document Revised: 03/20/2012 Document Reviewed: 02/06/2012 [facility] Patient Information 2015 [place], [place]. This information is not intended to replace advice given to you by your health care provider. Make sure you discuss any questions you have with your health care provider.

## 2014-03-17 NOTE — Progress Notes (Signed)
Chief Complaint:  Chief Complaint  Patient presents with  . Rectal Pain    had some constipation since saturday   . Vaginal Discharge    vaginal swelling     HPI: Sandy [name] is a 38 y.o. female who is here for has had a hx of constipation and hemorrhoids and took a stool softener and a laxative and now has had too much stool and has been wiping and the skin around her rectum is sore. It  hurts to wipe and brought tucks pad, she thought it would be better to keep the tucks pads overnight, she did this twice adn now has a lot of pain in the lower vaginal and perirectal area, she has discharge and it burns when she urinates in that area. She has never had this before.  She denies HSV hx, she had sex recently but with condom, She has had normal pap last year Sexually active but uses condoms. No hx of STDs. She is in a lot of pain. She is no longer constipated but the opposite of.   Past Medical History  Diagnosis Date  . Hypertension    Past Surgical History  Procedure Laterality Date  . Laparoscopic gastric banding     History   Social History  . Marital Status: Single    Spouse Name: N/A    Number of Children: N/A  . Years of Education: N/A   Social History Main Topics  . Smoking status: Never Smoker   . Smokeless tobacco: None  . Alcohol Use: Yes  . Drug Use: No  . Sexual Activity: Yes   Other Topics Concern  . None   Social History Narrative   Family History  Problem Relation Age of Onset  . Hypertension Mother   . Heart disease Mother   . Diabetes Mother   . Hypertension Father   . Heart disease Father    Allergies  Allergen Reactions  . Ace Inhibitors     Throat closes and lips swell   Prior to Admission medications   Medication Sig Start Date End Date Taking? Authorizing Provider  b complex vitamins tablet Take 1 tablet by mouth daily.   Yes Historical Provider, MD  clonazePAM (KLONOPIN) 0.5 MG tablet Take 1-2 tablets by mouth twice daily as  needed for anxiety. PATIENT NEEDS CHECK UP FOR ANXIETY WITH REGULAR PROVIDER FOR ADDITIONAL REFILLS 02/18/14  Yes Shade FloodJeffrey R Greene, MD  cyanocobalamin 500 MCG tablet Take 500 mcg by mouth daily.   Yes Historical Provider, MD  hydrochlorothiazide (MICROZIDE) 12.5 MG capsule Take 1 capsule (12.5 mg total) by mouth daily. 03/25/13  Yes Shade FloodJeffrey R Greene, MD  Multiple Vitamin (MULTIVITAMIN) tablet Take 1 tablet by mouth daily.   Yes Historical Provider, MD  pantoprazole (PROTONIX) 20 MG tablet Take 1 tablet (20 mg total) by mouth daily. 04/26/13  Yes Toy CookeyMegan Docherty, MD  fluticasone (FLONASE) 50 MCG/ACT nasal spray Place 2 sprays into both nostrils daily. Patient not taking: Reported on 03/17/2014 08/19/13   Nelva NayHeather M Marte, PA-C  HYDROcodone-homatropine [facility](HYCODAN) 5-1.5 MG/5ML syrup Take 5 mLs by mouth every 8 (eight) hours as needed for cough. Patient not taking: Reported on 03/17/2014 12/22/13   Elvina SidleKurt Lauenstein, MD  predniSONE (DELTASONE) 20 MG tablet Take 2 tablets (40 mg total) by mouth daily. Patient not taking: Reported on 03/17/2014 12/22/13   Elvina SidleKurt Lauenstein, MD     ROS: The patient denies fevers, chills, night sweats, unintentional weight loss, chest pain, palpitations, wheezing,  dyspnea on exertion, nausea, vomiting, abdominal pain,  hematuria, melena, numbness, weakness, or tingling.  All other systems have been reviewed and were otherwise negative with the exception of those mentioned in the HPI and as above.    PHYSICAL EXAM: Filed Vitals:   03/17/14 1708  BP: 142/90  Pulse: 90  Temp: 99 F (37.2 C)  Resp: 18   Filed Vitals:   03/17/14 1708  Height: 5\' 6"  (1.676 m)  Weight: 233 lb (105.688 kg)   Body mass index is 37.63 kg/(m^2).  General: Alert, no acute distress HEENT:  Normocephalic, atraumatic, oropharynx patent. EOMI, PERRLA Cardiovascular:  Regular rate and rhythm, no rubs murmurs or gallops.  No Carotid bruits, radial pulse intact. No pedal edema.  Respiratory: Clear to  auscultation bilaterally.  No wheezes, rales, or rhonchi.  No cyanosis, no use of accessory musculature GI: No organomegaly, abdomen is soft and non-tender, positive bowel sounds.  No masses. Skin: No rashes. Neurologic: Facial musculature symmetric. Psychiatric: Patient is appropriate throughout our interaction. Lymphatic: No cervical lymphadenopathy Musculoskeletal: Gait intact. GU-ulcerations perirectal area, no vesicles suspect it may be from tucks pad irriation being on over 10 hrs at a time x 2 nights, will get HSV cx I rectal fissures and abrasion from wiping She has white stringy dc No odor   LABS: Results for orders placed or performed in visit on 03/17/14  POCT UA - Microscopic Only  Result Value Ref Range   WBC, Ur, HPF, POC 3-5    RBC, urine, microscopic 2-4    Bacteria, U Microscopic 1+    Mucus, UA NEG    Epithelial cells, urine per micros 2-8    Crystals, Ur, HPF, POC NEG    Casts, Ur, LPF, POC NEG    Yeast, UA NEG   POCT urinalysis dipstick  Result Value Ref Range   Color, UA YELLOW    Clarity, UA CLEAR    Glucose, UA NEG    Bilirubin, UA NEG    Ketones, UA 40    Spec Grav, UA 1.010    Blood, UA TRACE-ITNACT    pH, UA 5.0    Protein, UA NEG    Urobilinogen, UA 0.2    Nitrite, UA NEG    Leukocytes, UA small (1+)   POCT Wet Prep with KOH  Result Value Ref Range   Trichomonas, UA Negative    Clue Cells Wet Prep HPF POC 50%    Epithelial Wet Prep HPF POC 5-15    Yeast Wet Prep HPF POC neg    Bacteria Wet Prep HPF POC 3+    RBC Wet Prep HPF POC 0-2    WBC Wet Prep HPF POC 4-10    KOH Prep POC Negative      EKG/XRAY:   [facility]   ASSESSMENT/PLAN: Encounter Diagnoses  Name Primary?  . External hemorrhoid Yes  . Vaginal discharge   . Screening for STD (sexually transmitted disease)   . Vaginal pain   . Rectal pain   . Abnormal urine   . Bacterial vaginitis    Diltiazem gel for rectal fissure Hydrocortisone  cream /suppository Emla cream for pain STD screening : GC, HSV cx Urine cx Flagyl for BV Sitz bath prn F/u prn    Gross sideeffects, risk and benefits, and alternatives of medications d/w patient. Patient is aware that all medications have potential sideeffects and we are unable to predict every sideeffect or drug-drug interaction that may occur.  Hamilton Capri PHUONG, DO 03/17/2014 6:55 PM

## 2014-03-19 LAB — GC/CHLAMYDIA PROBE AMP
CT Probe RNA: NEGATIVE
GC Probe RNA: NEGATIVE

## 2014-03-19 LAB — URINE CULTURE
Colony Count: NO GROWTH
Organism ID, Bacteria: NO GROWTH

## 2014-03-20 LAB — HERPES SIMPLEX VIRUS CULTURE: Organism ID, Bacteria: DETECTED

## 2014-03-24 ENCOUNTER — Telehealth: Payer: Self-pay | Admitting: Family Medicine

## 2014-03-24 NOTE — Telephone Encounter (Signed)
LM about  The rest of her labs, she can call me after 5 today if she wants to talk about them.

## 2014-03-25 ENCOUNTER — Telehealth: Payer: Self-pay | Admitting: Family Medicine

## 2014-03-25 NOTE — Telephone Encounter (Signed)
Spoke to pateint about labs, she is having a lot less pain, feels much better. Does not want valtrex for HSV1 but will ask for it if needed.

## 2014-05-26 IMAGING — US US ABDOMEN COMPLETE
1 series · 13 of 25 positions shown · non-contrast
Comparison: None.

CLINICAL DATA: 37-year-old female with abdominal pain.

EXAM:
ULTRASOUND ABDOMEN COMPLETE

[Series 1: us abdomen complete · 0.24mm/px · 13 of 68 slices shown]
[im 1/68]
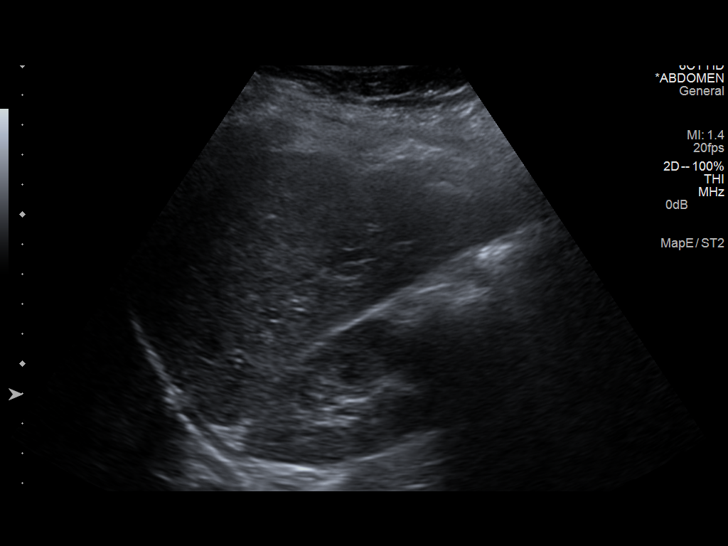
[im 6/68]
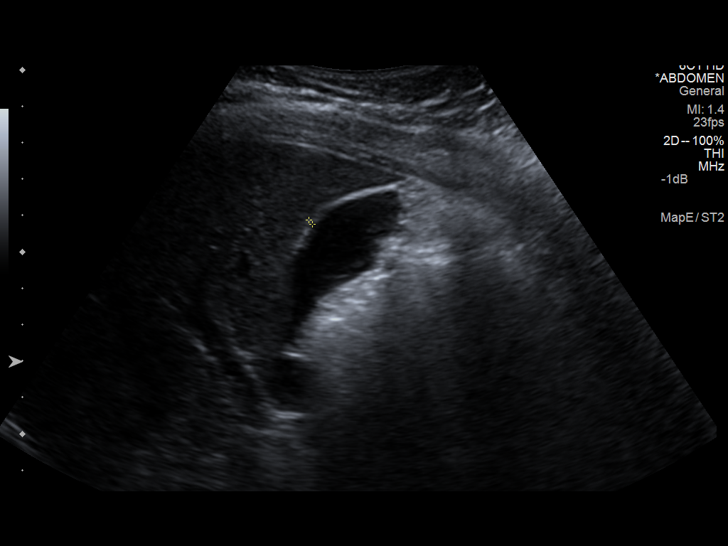
[im 12/68]
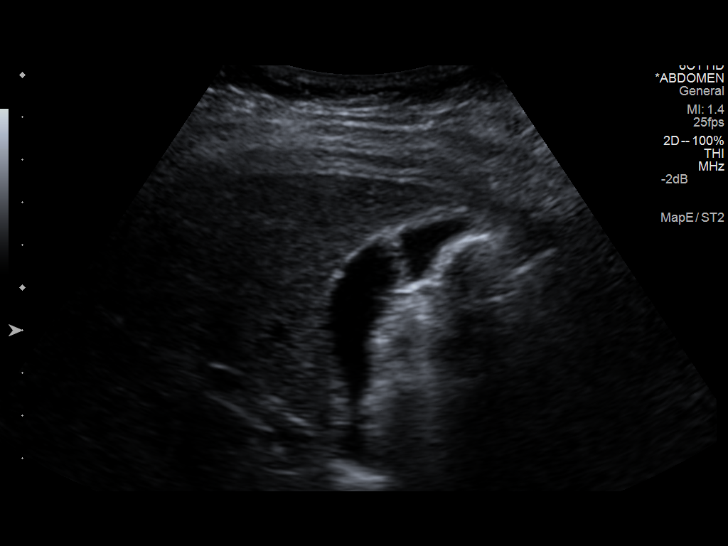
[im 17/68]
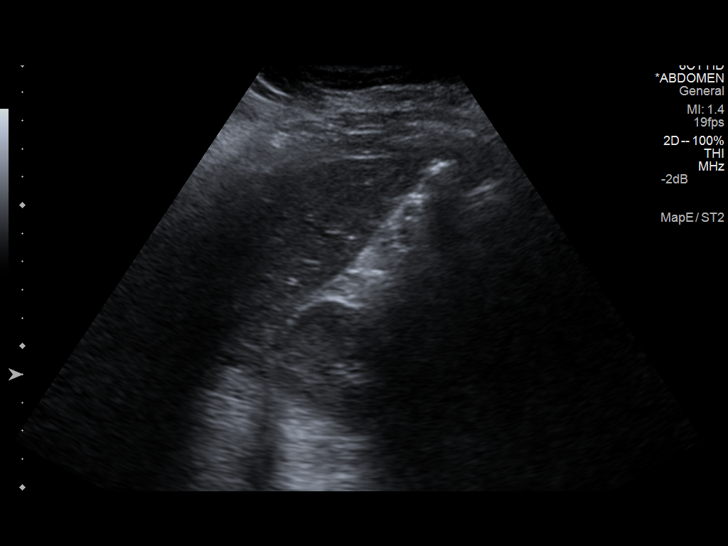
[im 23/68]
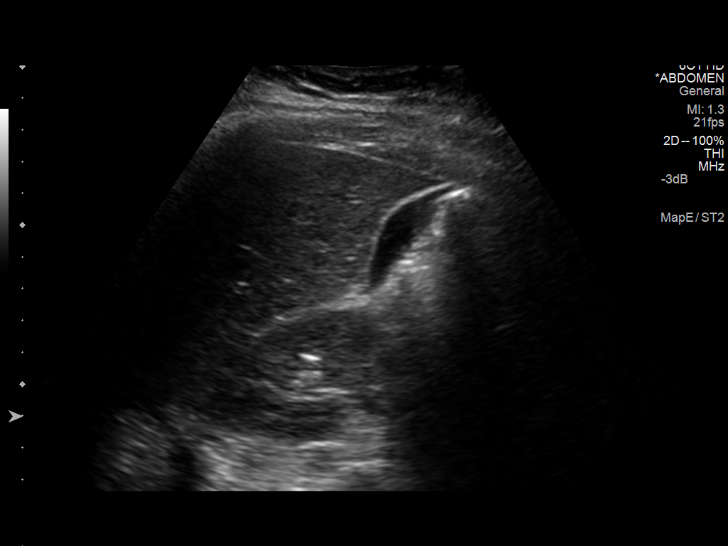
[im 28/68]
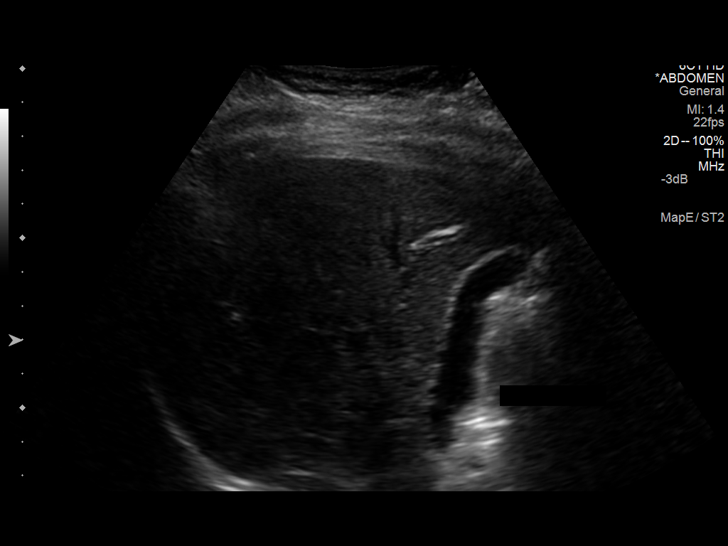
[im 34/68]
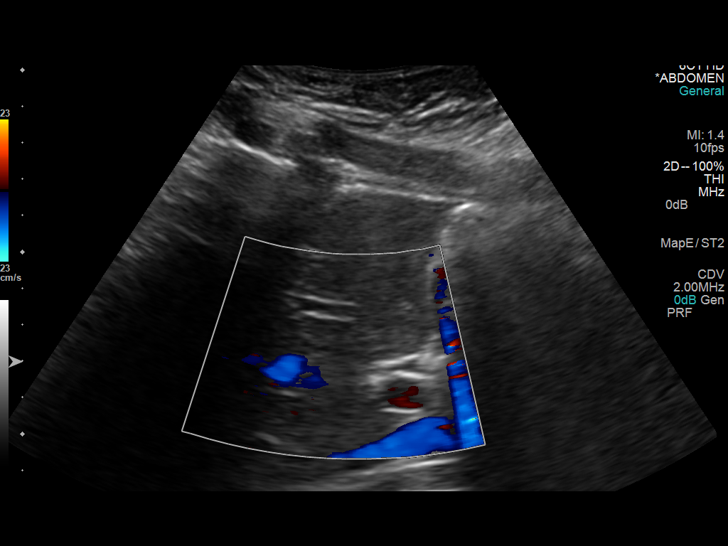
[im 40/68]
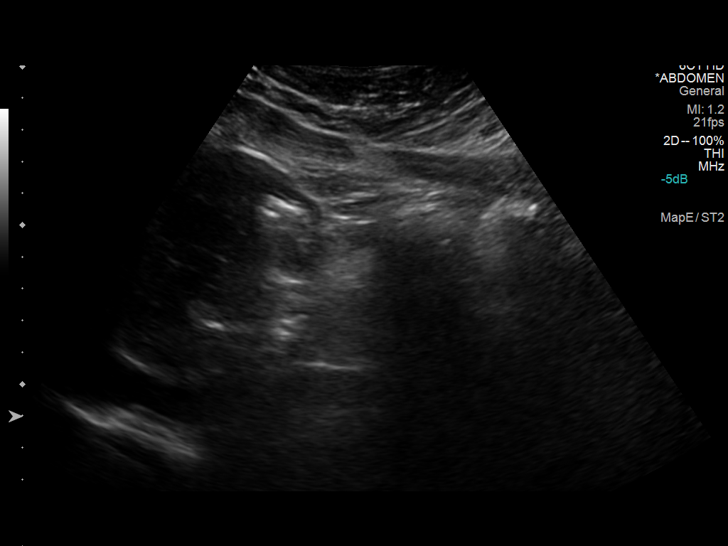
[im 45/68]
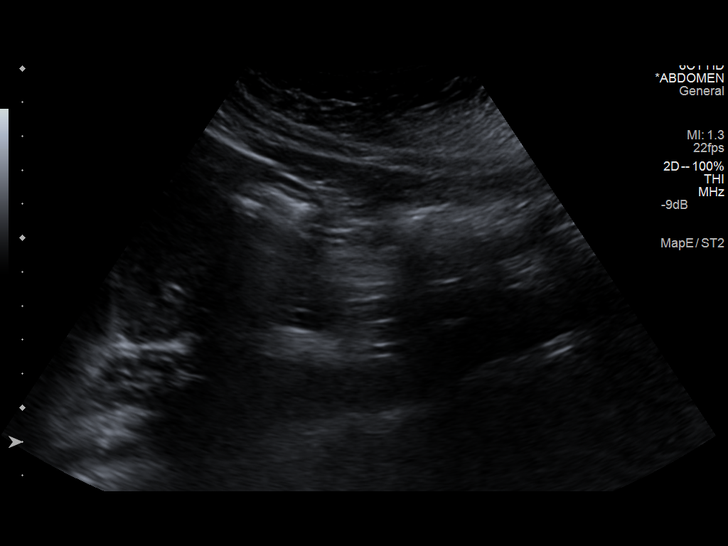
[im 51/68]
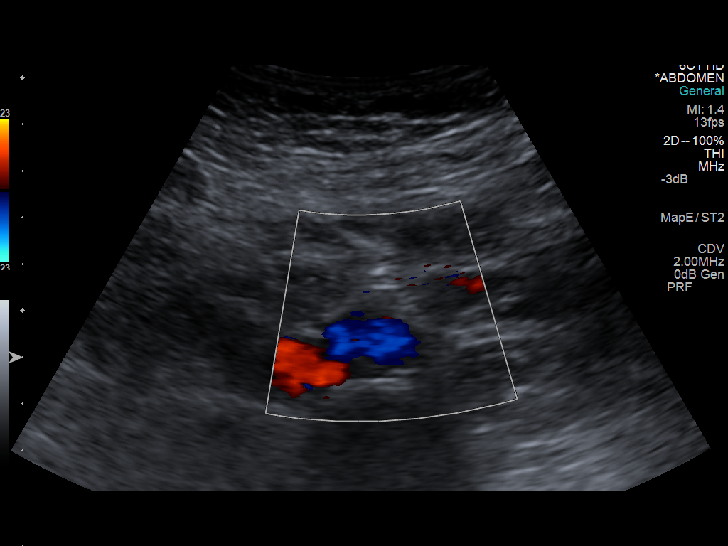
[im 56/68]
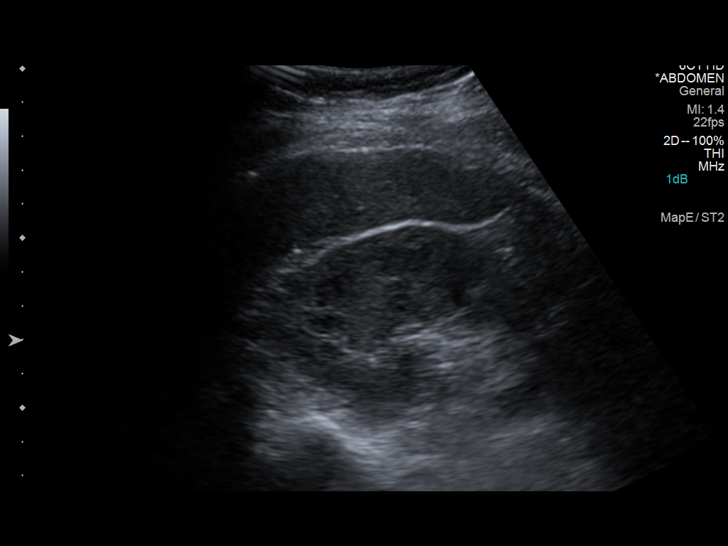
[im 62/68]
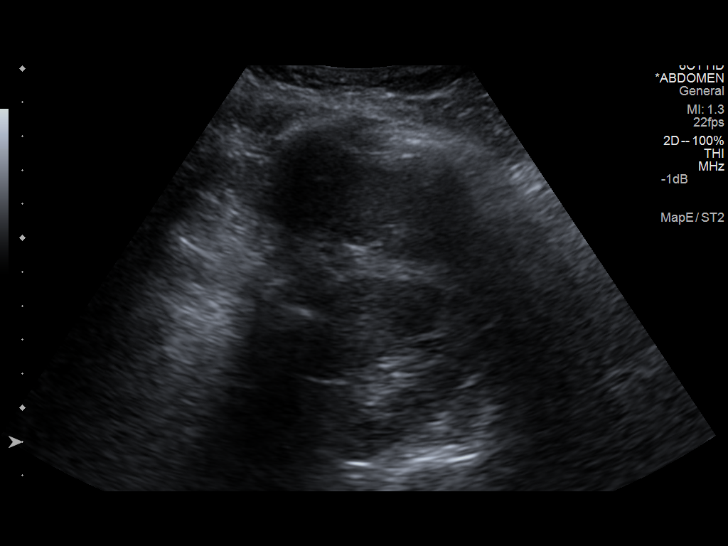
[im 68/68]
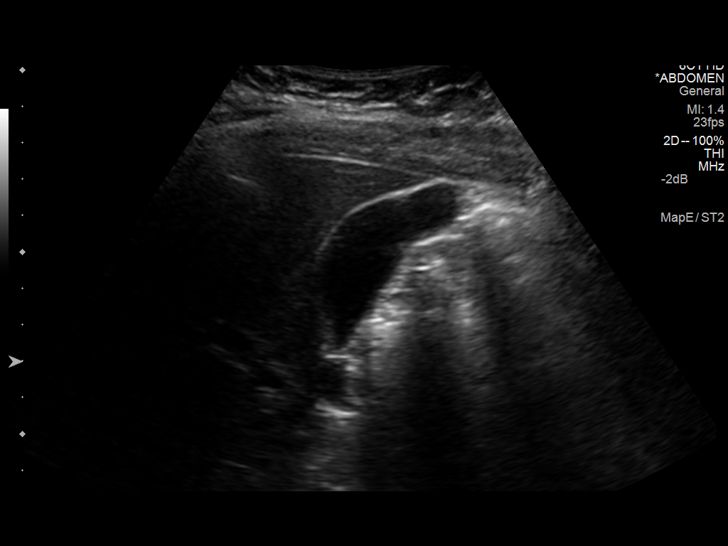

[13 of 25 positions shown; findings below may reference images not displayed]

FINDINGS: Gallbladder:

The gallbladder is unremarkable. There is no evidence of
cholelithiasis or cholecystitis.

Common bile duct:

Diameter: The visualized CBD is unremarkable measuring 4.5 mm. There
is no evidence of intrahepatic or extrahepatic biliary dilatation.

Liver:

No focal lesion identified. Within normal limits in parenchymal
echogenicity.

IVC:

No abnormality noted but the intrahepatic IVC is difficult to
visualize.

Pancreas:

Not well visualized secondary to bowel gas.

Spleen:

Size and appearance within normal limits.

Right Kidney:

Length: 9.7 cm. Echogenicity within normal limits. No mass or
hydronephrosis visualized. A 4 mm calculus within the mid kidney is
noted.

Left Kidney:

Length: 10.8 cm. Echogenicity within normal limits. No mass or
hydronephrosis visualized. A 4 mm calculus within the mid kidney is
noted.

Abdominal aorta:

No aneurysm visualized.

Other findings:

There is no evidence of ascites.
IMPRESSION: No acute abnormalities.  Unremarkable gallbladder.

Bilateral nonobstructing renal calculi.

Portions of the IVC and pancreas not well visualized secondary to
overlying bowel gas.

## 2015-03-08 ENCOUNTER — Encounter: Payer: Self-pay | Admitting: Family Medicine

## 2015-03-08 ENCOUNTER — Ambulatory Visit (INDEPENDENT_AMBULATORY_CARE_PROVIDER_SITE_OTHER): Payer: BC Managed Care – PPO | Admitting: Family Medicine

## 2015-03-08 VITALS — BP 140/100 | HR 90 | Temp 98.8°F | Resp 16 | Ht 66.0 in | Wt 254.2 lb

## 2015-03-08 DIAGNOSIS — I10 Essential (primary) hypertension: Secondary | ICD-10-CM | POA: Diagnosis not present

## 2015-03-08 DIAGNOSIS — F418 Other specified anxiety disorders: Secondary | ICD-10-CM | POA: Diagnosis not present

## 2015-03-08 LAB — BASIC METABOLIC PANEL
BUN: 11 mg/dL (ref 7–25)
CO2: 29 mmol/L (ref 20–31)
Calcium: 9.9 mg/dL (ref 8.6–10.2)
Chloride: 101 mmol/L (ref 98–110)
Creat: 0.66 mg/dL (ref 0.50–1.10)
Glucose, Bld: 79 mg/dL (ref 65–99)
Potassium: 4.2 mmol/L (ref 3.5–5.3)
SODIUM: 138 mmol/L (ref 135–146)

## 2015-03-08 MED ORDER — HYDROCHLOROTHIAZIDE 12.5 MG PO CAPS: 12.5000 mg | ORAL_CAPSULE | Freq: Every day | ORAL | Status: DC

## 2015-03-08 MED ORDER — CLONAZEPAM 0.5 MG PO TABS: 0.2500 mg | ORAL_TABLET | Freq: Two times a day (BID) | ORAL | Status: DC | PRN

## 2015-03-08 NOTE — Patient Instructions (Signed)
Restart hydrochlorothiazide once per day. Keep a record of your blood pressures outside of the office and bring them to the next office visit. If pressures are lower (100-110) on meds, you can try off meds, but if blood pressure over 140/90 - need to remain on meds.   Exercise and other stress management techniques as discussed, but klonopin as needed. If refill needed - let me know.   Follow up in next 3-6 months for physical and to follow up for the above concerns as well. Return to the clinic or go to the nearest emergency room if any of your symptoms worsen or new symptoms occur.   Stress and Stress Management Stress is a normal reaction to life events. It is what you feel when life demands more than you are used to or more than you can handle. Some stress can be useful. For example, the stress reaction can help you catch the last bus of the day, study for a test, or meet a deadline at work. But stress that occurs too often or for too long can cause problems. It can affect your emotional health and interfere with relationships and normal daily activities. Too much stress can weaken your immune system and increase your risk for physical illness. If you already have a medical problem, stress can make it worse. CAUSES  All sorts of life events may cause stress. An event that causes stress for one person may not be stressful for another person. Major life events commonly cause stress. These may be positive or negative. Examples include losing your job, moving into a new home, getting married, having a baby, or losing a loved one. Less obvious life events may also cause stress, especially if they occur day after day or in combination. Examples include working long hours, driving in traffic, caring for children, being in debt, or being in a difficult relationship. SIGNS AND SYMPTOMS Stress may cause emotional symptoms including, the following:  Anxiety. This is feeling worried, afraid, on edge,  overwhelmed, or out of control.  Anger. This is feeling irritated or impatient.  Depression. This is feeling sad, down, helpless, or guilty.  Difficulty focusing, remembering, or making decisions. Stress may cause physical symptoms, including the following:   Aches and pains. These may affect your head, neck, back, stomach, or other areas of your body.  Tight muscles or clenched jaw.  Low energy or trouble sleeping. Stress may cause unhealthy behaviors, including the following:   Eating to feel better (overeating) or skipping meals.  Sleeping too little, too much, or both.  Working too much or putting off tasks (procrastination).  Smoking, drinking alcohol, or using drugs to feel better. DIAGNOSIS  Stress is diagnosed through an assessment by your health care provider. Your health care provider will ask questions about your symptoms and any stressful life events.Your health care provider will also ask about your medical history and may order blood tests or other tests. Certain medical conditions and medicine can cause physical symptoms similar to stress. Mental illness can cause emotional symptoms and unhealthy behaviors similar to stress. Your health care provider may refer you to a mental health professional for further evaluation.  TREATMENT  Stress management is the recommended treatment for stress.The goals of stress management are reducing stressful life events and coping with stress in healthy ways.  Techniques for reducing stressful life events include the following:  Stress identification. Self-monitor for stress and identify what causes stress for you. These skills may help you to avoid  some stressful events.  Time management. Set your priorities, keep a calendar of events, and learn to say "no." These tools can help you avoid making too many commitments. Techniques for coping with stress include the following:  Rethinking the problem. Try to think realistically about  stressful events rather than ignoring them or overreacting. Try to find the positives in a stressful situation rather than focusing on the negatives.  Exercise. Physical exercise can release both physical and emotional tension. The key is to find a form of exercise you enjoy and do it regularly.  Relaxation techniques. These relax the body and mind. Examples include yoga, meditation, tai chi, biofeedback, deep breathing, progressive muscle relaxation, listening to music, being out in nature, journaling, and other hobbies. Again, the key is to find one or more that you enjoy and can do regularly.  Healthy lifestyle. Eat a balanced diet, get plenty of sleep, and do not smoke. Avoid using alcohol or drugs to relax.  Strong support network. Spend time with family, friends, or other people you enjoy being around.Express your feelings and talk things over with someone you trust. Counseling or talktherapy with a mental health professional may be helpful if you are having difficulty managing stress on your own. Medicine is typically not recommended for the treatment of stress.Talk to your health care provider if you think you need medicine for symptoms of stress. HOME CARE INSTRUCTIONS  Keep all follow-up visits as directed by your health care provider.  Take all medicines as directed by your health care provider. SEEK MEDICAL CARE IF:  Your symptoms get worse or you start having new symptoms.  You feel overwhelmed by your problems and can no longer manage them on your own. SEEK IMMEDIATE MEDICAL CARE IF:  You feel like hurting yourself or someone else.   This information is not intended to replace advice given to you by your health care provider. Make sure you discuss any questions you have with your health care provider.   Document Released: 09/27/2000 Document Revised: 04/24/2014 Document Reviewed: 11/26/2012 Elsevier Interactive Patient Education [employer].

## 2015-03-08 NOTE — Progress Notes (Signed)
Subjective:    Patient ID: Sandy [name], female    DOB: 02-13-1976, 38 y.o.   MRN: [id number] This chart was scribed for Sandy Ray, MD by Zola Button, Medical Scribe. This patient was seen in Room 25 and the patient's care was started at 4:47 PM.    HPI HPI Comments: Sandy [name] is a 39 y.o. female with a history of hypertension and anxiety who presents to the [facility] for a medication refill for clonazepam and HCTZ. Patient is not fasting today; she last ate around 12:45 PM today.  Hypertension: Last discussed with me in December, 2014. Patient states she has not been compliant with her blood pressure medications because she had some extremity swelling with the HCTZ. She has been taking the HCTZ about once a week and last took it today. She was getting blood pressure readings around 120/80 when she was on medications regularly. Patient denies dark/tarry stools, blood in stool, headaches, chest pain and SOB. Lab Results  Component Value Date   CREATININE 0.80 04/26/2013    Anxiety: I initially saw her for situational anxiety in December, 2014. Stress and relaxation techniques discussed. Short-term Klonopin if needed for flare of symptoms. She was last given a prescription for this in November, 2015 for as needed basis only. She has had some increased stress at work recently because her co-worker is on maternity leave, so she has been having to work more. 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 Patient has not been taking it recently, but she had been taking it around 4 times a month before. She had been walking and practicing deep breathing for stress relief, but had not always been able to relieve stress. She has been exercising less because she has been working more; she had been taking weightlifting classes, Zumba classes, and spin classes in the past. Patient notes she  has gained some weight. She has had minimal palpitations which she attributes to anxiety. She has stopped drinking coffee.  Heath maintenance: Patient declines flu shot today, but will consider getting one.   There are no active problems to display for this patient.  Past Medical History  Diagnosis Date  . Hypertension    Past Surgical History  Procedure Laterality Date  . Laparoscopic gastric banding     Allergies  Allergen Reactions  . Ace Inhibitors     Throat closes and lips swell   Prior to Admission medications   Medication Sig Start Date End Date Taking? Authorizing Provider  b complex vitamins tablet Take 1 tablet by mouth daily.   Yes Historical Provider, MD  clonazePAM (KLONOPIN) 0.5 MG tablet Take 1-2 tablets by mouth twice daily as needed for anxiety. PATIENT NEEDS CHECK UP FOR ANXIETY WITH REGULAR PROVIDER FOR ADDITIONAL REFILLS 02/18/14  Yes Wendie Agreste, MD  hydrochlorothiazide (MICROZIDE) 12.5 MG capsule Take 1 capsule (12.5 mg total) by mouth daily. 03/25/13  Yes Wendie Agreste, MD  Multiple Vitamin (MULTIVITAMIN) tablet Take 1 tablet by mouth daily.   Yes Historical Provider, MD  cyanocobalamin 500 MCG tablet Take 500 mcg by mouth daily.    Historical Provider, MD  diltiazem 2 % GEL Apply 1 application topically 3 (three) times daily as needed. Patient not taking: Reported on 03/08/2015 03/17/14   Thao P Le, DO  fluticasone (FLONASE) 50 MCG/ACT nasal spray Place 2 sprays into both nostrils daily. Patient not taking:  Reported on 03/17/2014 08/19/13   Heather M Marte, PA-C  hydrocortisone (ANUSOL-HC) 2.5 % rectal cream Place 1 application rectally 2 (two) times daily. Patient not taking: Reported on 03/08/2015 03/17/14   Thao P Le, DO  lidocaine-prilocaine (EMLA) cream Apply 1 application topically as needed. Patient not taking: Reported on 03/08/2015 03/17/14   Thao P Le, DO  pantoprazole (PROTONIX) 20 MG tablet Take 1 tablet (20 mg total) by mouth daily. Patient  not taking: Reported on 03/08/2015 04/26/13   Megan Docherty, MD  predniSONE (DELTASONE) 20 MG tablet Take 2 tablets (40 mg total) by mouth daily. Patient not taking: Reported on 03/17/2014 12/22/13   Kurt Lauenstein, MD   Social History   Social History  . Marital Status: Single    Spouse Name: Sandy [name]  . Number of Children: Sandy [name]  . Years of Education: Sandy [name]   Occupational History  . Not on file.   Social History Main Topics  . Smoking status: Never Smoker   . Smokeless tobacco: Not on file  . Alcohol Use: Yes  . Drug Use: No  . Sexual Activity: Yes   Other Topics Concern  . Not on file   Social History Narrative     Review of Systems  Constitutional: Negative for fatigue and unexpected weight change.  Respiratory: Negative for chest tightness and shortness of breath.   Cardiovascular: Positive for palpitations and leg swelling. Negative for chest pain.  Gastrointestinal: Negative for abdominal pain and blood in stool.  Neurological: Negative for dizziness, syncope, light-headedness and headaches.  Psychiatric/Behavioral: The patient is nervous/anxious.        Objective:   Physical Exam  Constitutional: She is oriented to person, place, and time. She appears well-developed and well-nourished.  HENT:  Head: Normocephalic and atraumatic.  Eyes: Conjunctivae and EOM are normal. Pupils are equal, round, and reactive to light.  Neck: Carotid bruit is not present.  Cardiovascular: Normal rate, regular rhythm, normal heart sounds and intact distal pulses.   Pulmonary/Chest: Effort normal and breath sounds normal.  Abdominal: Soft. She exhibits no pulsatile midline mass. There is no tenderness.  Neurological: She is alert and oriented to person, place, and time.  Skin: Skin is warm and dry.  Psychiatric: She has a normal mood and affect. Her behavior is normal.  Vitals reviewed.     Filed Vitals:   03/08/15 1548 03/08/15 1639  BP: 160/100 140/100  Pulse: 90   Temp: 98.8 F  (37.1 C)   TempSrc: Oral   Resp: 16   Height: 5' 6" (1.676 m)   Weight: 254 lb 3.2 oz (115.304 kg)   SpO2: 98%        Assessment & Plan:   [name] is a 39 y.o. female Essential hypertension - Plan: Basic metabolic panel, hydrochlorothiazide (MICROZIDE) 12.5 MG capsule  -Uncontrolled off meds. Restart HCTZ 12.5 mg daily. Check home blood pressure readings. If running lower, can do a trial off of medication, but if remaining over 140/90, needs to remain on HCTZ. BMP pending. Can plan on lipid testing at next physical.  Situational anxiety  -Stress and stress management was discussed. Okay to continue Klonopin on an as-needed basis for right now, restart exercise  Annual physical in 3-6 months.  Meds ordered this encounter  Medications  . clonazePAM (KLONOPIN) 0.5 MG tablet    Sig: Take 0.5-1 tablets (0.25-0.5 mg total) by mouth 2 (two) times daily as needed for anxiety.    Dispense:  20 tablet    Refill:    0  . hydrochlorothiazide (MICROZIDE) 12.5 MG capsule    Sig: Take 1 capsule (12.5 mg total) by mouth daily.    Dispense:  90 capsule    Refill:  1   Patient Instructions  Restart hydrochlorothiazide once per day. Keep a record of your blood pressures outside of the office and bring them to the next office visit. If pressures are lower (100-110) on meds, you can try off meds, but if blood pressure over 140/90 - need to remain on meds.   Exercise and other stress management techniques as discussed, but klonopin as needed. If refill needed - let me know.   Follow up in next 3-6 months for physical and to follow up for the above concerns as well. Return to the clinic or go to the nearest emergency room if any of your symptoms worsen or new symptoms occur.   Stress and Stress Management Stress is a normal reaction to life events. It is what you feel when life demands more than you are used to or more than you can handle. Some stress can be useful. For example, the stress  reaction can help you catch the last bus of the day, study for a test, or meet a deadline at work. But stress that occurs too often or for too long can cause problems. It can affect your emotional health and interfere with relationships and normal daily activities. Too much stress can weaken your immune system and increase your risk for physical illness. If you already have a medical problem, stress can make it worse. CAUSES  All sorts of life events may cause stress. An event that causes stress for one person may not be stressful for another person. Major life events commonly cause stress. These may be positive or negative. Examples include losing your job, moving into a new home, getting married, having a baby, or losing a loved one. Less obvious life events may also cause stress, especially if they occur day after day or in combination. Examples include working long hours, driving in traffic, caring for children, being in debt, or being in a difficult relationship. SIGNS AND SYMPTOMS Stress may cause emotional symptoms including, the following:  Anxiety. This is feeling worried, afraid, on edge, overwhelmed, or out of control.  Anger. This is feeling irritated or impatient.  Depression. This is feeling sad, down, helpless, or guilty.  Difficulty focusing, remembering, or making decisions. Stress may cause physical symptoms, including the following:   Aches and pains. These may affect your head, neck, back, stomach, or other areas of your body.  Tight muscles or clenched jaw.  Low energy or trouble sleeping. Stress may cause unhealthy behaviors, including the following:   Eating to feel better (overeating) or skipping meals.  Sleeping too little, too much, or both.  Working too much or putting off tasks (procrastination).  Smoking, drinking alcohol, or using drugs to feel better. DIAGNOSIS  Stress is diagnosed through an assessment by your health care provider. Your health care  provider will ask questions about your symptoms and any stressful life events.Your health care provider will also ask about your medical history and may order blood tests or other tests. Certain medical conditions and medicine can cause physical symptoms similar to stress. Mental illness can cause emotional symptoms and unhealthy behaviors similar to stress. Your health care provider may refer you to a mental health professional for further evaluation.  TREATMENT  Stress management is the recommended treatment for stress.The goals of stress management are reducing   stressful life events and coping with stress in healthy ways.  Techniques for reducing stressful life events include the following:  Stress identification. Self-monitor for stress and identify what causes stress for you. These skills may help you to avoid some stressful events.  Time management. Set your priorities, keep a calendar of events, and learn to say "no." These tools can help you avoid making too many commitments. Techniques for coping with stress include the following:  Rethinking the problem. Try to think realistically about stressful events rather than ignoring them or overreacting. Try to find the positives in a stressful situation rather than focusing on the negatives.  Exercise. Physical exercise can release both physical and emotional tension. The key is to find a form of exercise you enjoy and do it regularly.  Relaxation techniques. These relax the body and mind. Examples include yoga, meditation, tai chi, biofeedback, deep breathing, progressive muscle relaxation, listening to music, being out in nature, journaling, and other hobbies. Again, the key is to find one or more that you enjoy and can do regularly.  Healthy lifestyle. Eat a balanced diet, get plenty of sleep, and do not smoke. Avoid using alcohol or drugs to relax.  Strong support network. Spend time with family, friends, or other people you enjoy being  around.Express your feelings and talk things over with someone you trust. Counseling or talktherapy with a mental health professional may be helpful if you are having difficulty managing stress on your own. Medicine is typically not recommended for the treatment of stress.Talk to your health care provider if you think you need medicine for symptoms of stress. HOME CARE INSTRUCTIONS  Keep all follow-up visits as directed by your health care provider.  Take all medicines as directed by your health care provider. SEEK MEDICAL CARE IF:  Your symptoms get worse or you start having new symptoms.  You feel overwhelmed by your problems and can no longer manage them on your own. SEEK IMMEDIATE MEDICAL CARE IF:  You feel like hurting yourself or someone else.   This information is not intended to replace advice given to you by your health care provider. Make sure you discuss any questions you have with your health care provider.   Document Released: 09/27/2000 Document Revised: 04/24/2014 Document Reviewed: 11/26/2012 Elsevier Interactive Patient Education [employer].      I personally performed the services described in this documentation, which was scribed in my presence. The recorded information has been reviewed and considered, and addended by me as needed.    By signing my name below, I, Zola Button, attest that this documentation has been prepared under the direction and in the presence of Sandy Ray, MD.  Electronically Signed: Zola Button, Medical Scribe. 03/08/2015. 4:47 PM.

## 2015-07-12 ENCOUNTER — Encounter: Payer: BC Managed Care – PPO | Admitting: Family Medicine

## 2015-07-14 ENCOUNTER — Encounter: Payer: BC Managed Care – PPO | Admitting: Family Medicine

## 2015-08-17 ENCOUNTER — Other Ambulatory Visit: Payer: Self-pay | Admitting: Family Medicine

## 2015-08-17 NOTE — Telephone Encounter (Signed)
Refilled, but agrees she needs a follow-up appointment as planned.

## 2015-08-17 NOTE — Telephone Encounter (Signed)
I spoke with patient she states she is going to call tomorrow and try to get an appointment scheduled with you for a follow up

## 2015-08-18 NOTE — Telephone Encounter (Signed)
Called in. Notified pt of RF and need for f/up. 

## 2015-09-01 ENCOUNTER — Ambulatory Visit (INDEPENDENT_AMBULATORY_CARE_PROVIDER_SITE_OTHER): Payer: BC Managed Care – PPO | Admitting: Physician Assistant

## 2015-09-01 VITALS — BP 132/80 | HR 79 | Temp 98.9°F | Resp 17 | Ht 66.0 in | Wt 259.0 lb

## 2015-09-01 DIAGNOSIS — R070 Pain in throat: Secondary | ICD-10-CM

## 2015-09-01 LAB — POCT RAPID STREP A (OFFICE): Rapid Strep A Screen: NEGATIVE

## 2015-09-01 MED ORDER — AMOXICILLIN 500 MG PO CAPS: 500.0000 mg | ORAL_CAPSULE | Freq: Two times a day (BID) | ORAL | Status: AC

## 2015-09-01 NOTE — Progress Notes (Signed)
[facility] [address] [place] [address] [phone]  Date:  09/01/2015   Name:  Sandy [name]   DOB:  Jun 16, 1975   MRN:  [id number]  PCP:  No primary care provider on file.   Chief Complaint  Patient presents with  . Ear Pain  . Sore Throat   History of Present Illness:  Sandy [name] is a 40 y.o. female patient who presents to [facility] for cc of ear pain and sore throat.   Patient reports throat pain and ear pain for the last 3 days. She states that her symptoms begin with a sore throat followed by appreciating swollen lumps on the left side of her neck. She has had bilateral ear pain. It feels as if it is radiating up her neck. She denies any discharge or hearing loss. She has no nasal congestion. She has tried to use warm salt water for throat pain. She has had no sick contacts at work. 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 Their current reports of strep throat with various children. She has been in contact with one within the last week who was diagnosed with strep from a culture.  She denies any fever, shortness breath, or dyspnea.     There are no active problems to display for this patient.   Past Medical History  Diagnosis Date  . Hypertension     Past Surgical History  Procedure Laterality Date  . Laparoscopic gastric banding      Social History  Substance Use Topics  . Smoking status: Never Smoker   . Smokeless tobacco: None  . Alcohol Use: Yes    Family History  Problem Relation Age of Onset  . Hypertension Mother   . Heart disease Mother   . Diabetes Mother   . Hypertension Father   . Heart disease Father     Allergies  Allergen Reactions  . Ace Inhibitors     Throat closes and lips swell    Medication list has been reviewed and updated.  Current Outpatient Prescriptions on File Prior to Visit  Medication Sig Dispense Refill  . b complex vitamins tablet Take 1 tablet by mouth daily.    . clonazePAM (KLONOPIN) 0.5 MG tablet  TAKE 1/2- 1 TABLET BY MOUTH TWICE DAILY AS NEEDED FOR ANXIETY 20 tablet 0  . hydrochlorothiazide (MICROZIDE) 12.5 MG capsule Take 1 capsule (12.5 mg total) by mouth daily. 90 capsule 1  . Multiple Vitamin (MULTIVITAMIN) tablet Take 1 tablet by mouth daily.    . cyanocobalamin 500 MCG tablet Take 500 mcg by mouth daily. Reported on 09/01/2015     No current facility-administered medications on file prior to visit.    ROS ROS otherwise unremarkable unless listed above.   Physical Examination: BP 132/80 mmHg  Pulse 79  Temp(Src) 98.9 F (37.2 C) (Oral)  Resp 17  Ht  (1.676 m)  Wt 259 lb (117.482 kg)  BMI 41.82 kg/m2  SpO2 100%  LMP 08/05/2015 Ideal Body Weight: Weight in (lb) to have BMI = 25: 154.6  Physical Exam  Constitutional: She is oriented to person, place, and time. She appears well-developed and well-nourished. No distress.  HENT:  Head: Normocephalic and atraumatic.  Right Ear: Tympanic membrane, external ear and ear canal normal.  Left Ear: Tympanic membrane, external ear and ear canal normal.  Nose: No mucosal edema or rhinorrhea.  Mouth/Throat: No uvula swelling. Oropharyngeal exudate, posterior oropharyngeal edema and posterior oropharyngeal erythema present.  Eyes: Conjunctivae and EOM  are normal. Pupils are equal, round, and reactive to light.  Cardiovascular: Normal rate.   Pulmonary/Chest: Effort normal. No respiratory distress.  Lymphadenopathy:    She has no cervical adenopathy.  Neurological: She is alert and oriented to person, place, and time.  Skin: She is not diaphoretic.  Psychiatric: She has a normal mood and affect. Her behavior is normal.     Assessment and Plan: Sandy [name] is a 40 y.o. female who is here today for chief complaint of throat pain. Patient will go ahead and have amoxicillin at this time. She has had close contact with strep and her tonsils appear that this could be bacterial. Advised Tylenol and Advil for pain and fever.  Also advised typical lozenges. She declines magic mouthwash   Throat pain - Plan: POCT rapid strep A, amoxicillin (AMOXIL) 500 MG capsule  Trena PlattStephanie English, PA-C [facility] 09/01/2015 10:18 AM

## 2015-09-01 NOTE — Patient Instructions (Addendum)
   IF you received an x-ray today, you will receive an invoice from [facility]. Please contact [facility] at [phone] with questions or concerns regarding your invoice.   IF you received labwork today, you will receive an invoice from [facility]. Please contact [facility] at [phone] with questions or concerns regarding your invoice.   Our billing staff will not be able to assist you with questions regarding bills from these companies.  You will be contacted with the lab results as soon as they are available. The fastest way to get your results is to activate your My Chart account. Instructions are located on the last page of this paperwork. If you have not heard from us regarding the results in 2 weeks, please contact this office.    Strep Throat Strep throat is a bacterial infection of the throat. Your health care provider may call the infection tonsillitis or pharyngitis, depending on whether there is swelling in the tonsils or at the back of the throat. Strep throat is most common during the cold months of the year in children who are 5-15 years of age, but it can happen during any season in people of any age. This infection is spread from person to person (contagious) through coughing, sneezing, or close contact. CAUSES Strep throat is caused by the bacteria called Streptococcus pyogenes. RISK FACTORS This condition is more likely to develop in:  People who spend time in crowded places where the infection can spread easily.  People who have close contact with someone who has strep throat. SYMPTOMS Symptoms of this condition include:  Fever or chills.   Redness, swelling, or pain in the tonsils or throat.  Pain or difficulty when swallowing.  White or yellow spots on the tonsils or throat.  Swollen, tender glands in the neck or under the jaw.  Red rash all over the body (rare). DIAGNOSIS This condition is diagnosed by  performing a rapid strep test or by taking a swab of your throat (throat culture test). Results from a rapid strep test are usually ready in a few minutes, but throat culture test results are available after one or two days. TREATMENT This condition is treated with antibiotic medicine. HOME CARE INSTRUCTIONS Medicines  Take over-the-counter and prescription medicines only as told by your health care provider.  Take your antibiotic as told by your health care provider. Do not stop taking the antibiotic even if you start to feel better.  Have family members who also have a sore throat or fever tested for strep throat. They may need antibiotics if they have the strep infection. Eating and Drinking  Do not share food, drinking cups, or personal items that could cause the infection to spread to other people.  If swallowing is difficult, try eating soft foods until your sore throat feels better.  Drink enough fluid to keep your urine clear or pale yellow. General Instructions  Gargle with a salt-water mixture 3-4 times per day or as needed. To make a salt-water mixture, completely dissolve -1 tsp of salt in 1 cup of warm water.  Make sure that all household members wash their hands well.  Get plenty of rest.  Stay home from school or work until you have been taking antibiotics for 24 hours.  Keep all follow-up visits as told by your health care provider. This is important. SEEK MEDICAL CARE IF:  The glands in your neck continue to get bigger.  You develop a rash, cough, or earache.    You cough up a thick liquid that is green, yellow-brown, or bloody.  You have pain or discomfort that does not get better with medicine.  Your problems seem to be getting worse rather than better.  You have a fever. SEEK IMMEDIATE MEDICAL CARE IF:  You have new symptoms, such as vomiting, severe headache, stiff or painful neck, chest pain, or shortness of breath.  You have severe throat pain,  drooling, or changes in your voice.  You have swelling of the neck, or the skin on the neck becomes red and tender.  You have signs of dehydration, such as fatigue, dry mouth, and decreased urination.  You become increasingly sleepy, or you cannot wake up completely.  Your joints become red or painful.   This information is not intended to replace advice given to you by your health care provider. Make sure you discuss any questions you have with your health care provider.   Document Released: 03/31/2000 Document Revised: 12/23/2014 Document Reviewed: 07/27/2014 Elsevier Interactive Patient Education 2016 Elsevier [employer]  

## 2015-09-03 LAB — CULTURE, GROUP A STREP: ORGANISM ID, BACTERIA: NORMAL

## 2016-12-11 ENCOUNTER — Ambulatory Visit (INDEPENDENT_AMBULATORY_CARE_PROVIDER_SITE_OTHER): Payer: BC Managed Care – PPO | Admitting: Family Medicine

## 2016-12-11 ENCOUNTER — Encounter: Payer: Self-pay | Admitting: Family Medicine

## 2016-12-11 VITALS — BP 183/99 | HR 83 | Temp 98.3°F | Resp 16 | Ht 66.0 in | Wt 258.0 lb

## 2016-12-11 DIAGNOSIS — Z634 Disappearance and death of family member: Secondary | ICD-10-CM

## 2016-12-11 DIAGNOSIS — R42 Dizziness and giddiness: Secondary | ICD-10-CM

## 2016-12-11 DIAGNOSIS — I10 Essential (primary) hypertension: Secondary | ICD-10-CM | POA: Diagnosis not present

## 2016-12-11 DIAGNOSIS — R51 Headache: Secondary | ICD-10-CM | POA: Diagnosis not present

## 2016-12-11 DIAGNOSIS — F411 Generalized anxiety disorder: Secondary | ICD-10-CM | POA: Diagnosis not present

## 2016-12-11 DIAGNOSIS — F439 Reaction to severe stress, unspecified: Secondary | ICD-10-CM | POA: Diagnosis not present

## 2016-12-11 DIAGNOSIS — R519 Headache, unspecified: Secondary | ICD-10-CM

## 2016-12-11 MED ORDER — CLONAZEPAM 0.5 MG PO TABS: ORAL_TABLET | ORAL | 0 refills | Status: DC

## 2016-12-11 MED ORDER — HYDROCHLOROTHIAZIDE 12.5 MG PO CAPS: 12.5000 mg | ORAL_CAPSULE | Freq: Every day | ORAL | 1 refills | Status: DC

## 2016-12-11 NOTE — Patient Instructions (Addendum)
For blood pressure, will restart hydrochlorothiazide once per day, but return in next 2 weeks with home readings as you will likely need to be on additional medication. If blood pressure is not under 160/100 in next 4-5 days, then increase to 2 pills per day of the hydrochlorothiazide (total dose 72m).   Return to the clinic or go to the nearest emergency room if any of your symptoms worsen or new symptoms occur.  Exercise with walking or other form of exercise every day as part of the treatment for the anxiety.  I would consider meeting with counselor to make sure you are doing ok from the grieving standpoint, as well as discuss anxiety.[phone]    Stress and Stress Management Stress is a normal reaction to life events. It is what you feel when life demands more than you are used to or more than you can handle. Some stress can be useful. For example, the stress reaction can help you catch the last bus of the day, study for a test, or meet a deadline at work. But stress that occurs too often or for too long can cause problems. It can affect your emotional health and interfere with relationships and normal daily activities. Too much stress can weaken your immune system and increase your risk for physical illness. If you already have a medical problem, stress can make it worse. What are the causes? All sorts of life events may cause stress. An event that causes stress for one person may not be stressful for another person. Major life events commonly cause stress. These may be positive or negative. Examples include losing your job, moving into a new home, getting married, having a baby, or losing a loved one. Less obvious life events may also cause stress, especially if they occur day after day or in combination. Examples include working long hours, driving in traffic, caring for children, being in debt, or being in a difficult relationship. What are the signs or symptoms? Stress  may cause emotional symptoms including, the following:  Anxiety. This is feeling worried, afraid, on edge, overwhelmed, or out of control.  Anger. This is feeling irritated or impatient.  Depression. This is feeling sad, down, helpless, or guilty.  Difficulty focusing, remembering, or making decisions.  Stress may cause physical symptoms, including the following:  Aches and pains. These may affect your head, neck, back, stomach, or other areas of your body.  Tight muscles or clenched jaw.  Low energy or trouble sleeping.  Stress may cause unhealthy behaviors, including the following:  Eating to feel better (overeating) or skipping meals.  Sleeping too little, too much, or both.  Working too much or putting off tasks (procrastination).  Smoking, drinking alcohol, or using drugs to feel better.  How is this diagnosed? Stress is diagnosed through an assessment by your health care provider. Your health care provider will ask questions about your symptoms and any stressful life events.Your health care provider will also ask about your medical history and may order blood tests or other tests. Certain medical conditions and medicine can cause physical symptoms similar to stress. Mental illness can cause emotional symptoms and unhealthy behaviors similar to stress. Your health care provider may refer you to a mental health professional for further evaluation. How is this treated? Stress management is the recommended treatment for stress.The goals of stress management are reducing stressful life events and coping with stress in healthy ways. Techniques for reducing stressful life events include the following:  Stress identification. Self-monitor for stress and identify what causes stress for you. These skills may help you to avoid some stressful events.  Time management. Set your priorities, keep a calendar of events, and learn to say "no." These tools can help you avoid making too many  commitments.  Techniques for coping with stress include the following:  Rethinking the problem. Try to think realistically about stressful events rather than ignoring them or overreacting. Try to find the positives in a stressful situation rather than focusing on the negatives.  Exercise. Physical exercise can release both physical and emotional tension. The key is to find a form of exercise you enjoy and do it regularly.  Relaxation techniques. These relax the body and mind. Examples include yoga, meditation, tai chi, biofeedback, deep breathing, progressive muscle relaxation, listening to music, being out in nature, journaling, and other hobbies. Again, the key is to find one or more that you enjoy and can do regularly.  Healthy lifestyle. Eat a balanced diet, get plenty of sleep, and do not smoke. Avoid using alcohol or drugs to relax.  Strong support network. Spend time with family, friends, or other people you enjoy being around.Express your feelings and talk things over with someone you trust.  Counseling or talktherapy with a mental health professional may be helpful if you are having difficulty managing stress on your own. Medicine is typically not recommended for the treatment of stress.Talk to your health care provider if you think you need medicine for symptoms of stress. Follow these instructions at home:  Keep all follow-up visits as directed by your health care provider.  Take all medicines as directed by your health care provider. Contact a health care provider if:  Your symptoms get worse or you start having new symptoms.  You feel overwhelmed by your problems and can no longer manage them on your own. Get help right away if:  You feel like hurting yourself or someone else. This information is not intended to replace advice given to you by your health care provider. Make sure you discuss any questions you have with your health care provider. Document Released:  09/27/2000 Document Revised: 09/09/2015 Document Reviewed: 11/26/2012 Elsevier Interactive Patient Education  2017 [employer].   Hypertension Hypertension, commonly called high blood pressure, is when the force of blood pumping through the arteries is too strong. The arteries are the blood vessels that carry blood from the heart throughout the body. Hypertension forces the heart to work harder to pump blood and may cause arteries to become narrow or stiff. Having untreated or uncontrolled hypertension can cause heart attacks, strokes, kidney disease, and other problems. A blood pressure reading consists of a higher number over a lower number. Ideally, your blood pressure should be below 120/80. The first ("top") number is called the systolic pressure. It is a measure of the pressure in your arteries as your heart beats. The second ("bottom") number is called the diastolic pressure. It is a measure of the pressure in your arteries as the heart relaxes. What are the causes? The cause of this condition is not known. What increases the risk? Some risk factors for high blood pressure are under your control. Others are not. Factors you can change  Smoking.  Having type 2 diabetes mellitus, high cholesterol, or both.  Not getting enough exercise or physical activity.  Being overweight.  Having too much fat, sugar, calories, or salt (sodium) in your diet.  Drinking too much alcohol. Factors that are difficult or impossible  to change  Having chronic kidney disease.  Having a family history of high blood pressure.  Age. Risk increases with age.  Race. You may be at higher risk if you are African-American.  Gender. Men are at higher risk than women before age 10. After age 51, women are at higher risk than men.  Having obstructive sleep apnea.  Stress. What are the signs or symptoms? Extremely high blood pressure (hypertensive crisis) may cause:  Headache.  Anxiety.  Shortness  of breath.  Nosebleed.  Nausea and vomiting.  Severe chest pain.  Jerky movements you cannot control (seizures).  How is this diagnosed? This condition is diagnosed by measuring your blood pressure while you are seated, with your arm resting on a surface. The cuff of the blood pressure monitor will be placed directly against the skin of your upper arm at the level of your heart. It should be measured at least twice using the same arm. Certain conditions can cause a difference in blood pressure between your right and left arms. Certain factors can cause blood pressure readings to be lower or higher than normal (elevated) for a short period of time:  When your blood pressure is higher when you are in a health care provider's office than when you are at home, this is called white coat hypertension. Most people with this condition do not need medicines.  When your blood pressure is higher at home than when you are in a health care provider's office, this is called masked hypertension. Most people with this condition may need medicines to control blood pressure.  If you have a high blood pressure reading during one visit or you have normal blood pressure with other risk factors:  You may be asked to return on a different day to have your blood pressure checked again.  You may be asked to monitor your blood pressure at home for 1 week or longer.  If you are diagnosed with hypertension, you may have other blood or imaging tests to help your health care provider understand your overall risk for other conditions. How is this treated? This condition is treated by making healthy lifestyle changes, such as eating healthy foods, exercising more, and reducing your alcohol intake. Your health care provider may prescribe medicine if lifestyle changes are not enough to get your blood pressure under control, and if:  Your systolic blood pressure is above 130.  Your diastolic blood pressure is above  80.  Your personal target blood pressure may vary depending on your medical conditions, your age, and other factors. Follow these instructions at home: Eating and drinking  Eat a diet that is high in fiber and potassium, and low in sodium, added sugar, and fat. An example eating plan is called the DASH (Dietary Approaches to Stop Hypertension) diet. To eat this way: ? Eat plenty of fresh fruits and vegetables. Try to fill half of your plate at each meal with fruits and vegetables. ? Eat whole grains, such as whole wheat pasta, brown rice, or whole grain bread. Fill about one quarter of your plate with whole grains. ? Eat or drink low-fat dairy products, such as skim milk or low-fat yogurt. ? Avoid fatty cuts of meat, processed or cured meats, and poultry with skin. Fill about one quarter of your plate with lean proteins, such as fish, chicken without skin, beans, eggs, and tofu. ? Avoid premade and processed foods. These tend to be higher in sodium, added sugar, and fat.  Reduce your daily  sodium intake. Most people with hypertension should eat less than 1,500 mg of sodium a day.  Limit alcohol intake to no more than 1 drink a day for nonpregnant women and 2 drinks a day for men. One drink equals 12 oz of beer, 5 oz of wine, or 1 oz of hard liquor. Lifestyle  Work with your health care provider to maintain a healthy body weight or to lose weight. Ask what an ideal weight is for you.  Get at least 30 minutes of exercise that causes your heart to beat faster (aerobic exercise) most days of the week. Activities may include walking, swimming, or biking.  Include exercise to strengthen your muscles (resistance exercise), such as pilates or lifting weights, as part of your weekly exercise routine. Try to do these types of exercises for 30 minutes at least 3 days a week.  Do not use any products that contain nicotine or tobacco, such as cigarettes and e-cigarettes. If you need help quitting, ask  your health care provider.  Monitor your blood pressure at home as told by your health care provider.  Keep all follow-up visits as told by your health care provider. This is important. Medicines  Take over-the-counter and prescription medicines only as told by your health care provider. Follow directions carefully. Blood pressure medicines must be taken as prescribed.  Do not skip doses of blood pressure medicine. Doing this puts you at risk for problems and can make the medicine less effective.  Ask your health care provider about side effects or reactions to medicines that you should watch for. Contact a health care provider if:  You think you are having a reaction to a medicine you are taking.  You have headaches that keep coming back (recurring).  You feel dizzy.  You have swelling in your ankles.  You have trouble with your vision. Get help right away if:  You develop a severe headache or confusion.  You have unusual weakness or numbness.  You feel faint.  You have severe pain in your chest or abdomen.  You vomit repeatedly.  You have trouble breathing. Summary  Hypertension is when the force of blood pumping through your arteries is too strong. If this condition is not controlled, it may put you at risk for serious complications.  Your personal target blood pressure may vary depending on your medical conditions, your age, and other factors. For most people, a normal blood pressure is less than 120/80.  Hypertension is treated with lifestyle changes, medicines, or a combination of both. Lifestyle changes include weight loss, eating a healthy, low-sodium diet, exercising more, and limiting alcohol. This information is not intended to replace advice given to you by your health care provider. Make sure you discuss any questions you have with your health care provider. Document Released: 04/03/2005 Document Revised: 03/01/2016 Document Reviewed: 03/01/2016 Elsevier  Interactive Patient Education  2018 [employer].   IF you received an x-ray today, you will receive an invoice from [facility]. Please contact [facility] at [phone] with questions or concerns regarding your invoice.   IF you received labwork today, you will receive an invoice from [facility]. Please contact [facility] at [phone] with questions or concerns regarding your invoice.   Our billing staff will not be able to assist you with questions regarding bills from these companies.  You will be contacted with the lab results as soon as they are available. The fastest way to get your results is to activate your My Chart account.  Instructions are located on the last page of this paperwork. If you have not heard from [place] regarding the results in 2 weeks, please contact this office.

## 2016-12-11 NOTE — Progress Notes (Signed)
Subjective:  By signing my name below, I, Moises Blood, attest that this documentation has been prepared under the direction and in the presence of [name], MD. Electronically Signed: Moises Blood, [place]. 12/11/2016 , 4:13 PM .  Patient was seen in Room 2 .   Patient ID: Sandy [name], female    DOB: March 26, 1976, 41 y.o.   MRN: [id number] Chief Complaint  Patient presents with  . Dizziness    headache  . Medication Refill    klonopin and hctz   HPI Sandy [name] is a 41 y.o. female Here for headache and dizziness, as well as medication refill. Her last meal was 12:45PM today.   HTN Last discussed and saw her in Nov 2016 with BP 140/100. She was off medications at that time. Restarted HCTZ 12.51m QD.   She reports not taking her HCTZ every day. She was only prescribed 6 months worth in Nov 2016, but only ran out about a week ago. She denies having a BP cuff at home, but her neighbor has one and she will try to borrow it to check her BP outside of office.   She uses eye drops for her glaucoma; sees eye doctor every 3 months. Her pressures are under control.   Anxiety  Situational anxiety with episodic klonopin. Stress management discussed in 2016 visit; recommended 3-6 month follow up.   Patient informs anxiety doing well, taking klonopin as needed. She was finishing up her Ph.D program since last visit. She also lost her mother in Nov 2017, as well as other members since then (total of 9). She recently ran out of her klonopin (last prescription #20 in May 2017). She states having stress headaches and dizziness, and calms down with the klonopin. She reports having mild tension right now. She denies weakness, or slurred speech.   She has not met with a counselor because that's her degree; she feels like she already knows what they'll talk about and believes she can handle it herself. She agrees to meeting with a cSocial worker She is still grieving over her father, even though  he's been gone for over 15 years. Her mother's death was over 3 weeks, and it's been difficult.   She hasn't been exercising regularly since Nov 2017. She would occasionally walk just to have a breather, not for exercise.   There are no active problems to display for this patient.  Past Medical History:  Diagnosis Date  . Anxiety   . Hypertension    Past Surgical History:  Procedure Laterality Date  . LAPAROSCOPIC GASTRIC BANDING     Allergies  Allergen Reactions  . Ace Inhibitors     Throat closes and lips swell  . Shellfish Allergy    Prior to Admission medications   Medication Sig Start Date End Date Taking? Authorizing Provider  b complex vitamins tablet Take 1 tablet by mouth daily.    [provider]  clonazePAM (KLONOPIN) 0.5 MG tablet TAKE 1/2- 1 TABLET BY MOUTH TWICE DAILY AS NEEDED FOR ANXIETY 08/17/15   GWendie Agreste MD  cyanocobalamin 500 MCG tablet Take 500 mcg by mouth daily. Reported on 09/01/2015    [provider]  hydrochlorothiazide (MICROZIDE) 12.5 MG capsule Take 1 capsule (12.5 mg total) by mouth daily. 03/08/15   GWendie Agreste MD  Multiple Vitamin (MULTIVITAMIN) tablet Take 1 tablet by mouth daily.    [provider]   Social History   Social History  . Marital status: Single  Spouse name: N/A  . Number of children: N/A  . Years of education: N/A   Occupational History  . Not on file.   Social History Main Topics  . Smoking status: Never Smoker  . Smokeless tobacco: Never Used  . Alcohol use Yes  . Drug use: No  . Sexual activity: Yes   Other Topics Concern  . Not on file   Social History Narrative  . No narrative on file   Review of Systems  Constitutional: Negative for fatigue and unexpected weight change.  Respiratory: Negative for chest tightness and shortness of breath.   Cardiovascular: Negative for chest pain, palpitations and leg swelling.  Gastrointestinal: Negative for abdominal pain and  blood in stool.  Neurological: Positive for dizziness and headaches. Negative for syncope, speech difficulty, weakness and light-headedness.  Psychiatric/Behavioral: The patient is nervous/anxious.        Objective:   Physical Exam  Constitutional: She is oriented to person, place, and time. She appears well-developed and well-nourished.  HENT:  Head: Normocephalic and atraumatic.  Eyes: Pupils are equal, round, and reactive to light. Conjunctivae and EOM are normal. Right eye exhibits no nystagmus. Left eye exhibits no nystagmus.  Neck: Carotid bruit is not present.  Cardiovascular: Normal rate, regular rhythm, normal heart sounds and intact distal pulses.   Pulmonary/Chest: Effort normal and breath sounds normal.  Abdominal: Soft. She exhibits no pulsatile midline mass. There is no tenderness.  Neurological: She is alert and oriented to person, place, and time. She displays a negative Romberg sign.  No pronator drift, no facial droop, non focal exam, normal heel-to-toe gait, normal finger-to-nose  Skin: Skin is warm and dry.  Psychiatric: She has a normal mood and affect. Her behavior is normal.  Vitals reviewed.   Vitals:   12/11/16 1541  BP: (!) 183/99  Pulse: 83  Resp: 16  Temp: 98.3 F (36.8 C)  TempSrc: Oral  SpO2: 100%  Weight: 258 lb (117 kg)  Height: _0  (1.676 m)   Orthostatic VS for the past 24 hrs (Last 3 readings):  BP- Lying Pulse- Lying BP- Sitting Pulse- Sitting BP- Standing at 0 minutes Pulse- Standing at 0 minutes  12/11/16 1551 (!) 169/112 81 (!) 170/123 80 (!) 169/122 80       Assessment & Plan:  Sandy [name] is a 41 y.o. female Situational stress Anxiety state Death of family member  - Situational stress previously, with significant increases in stressors including multiple family members have passed since last visit. Has noticed some increasing anxiety symptoms returning.   -Refilled Klonopin for when necessary use, stress management  techniques discussed with handout, phone number for hospice counseling if she felt that would be helpful, and recommended exercise. Recheck in the next few weeks.  Essential hypertension - Plan: hydrochlorothiazide (MICROZIDE) 12.5 MG capsule, Basic metabolic panel  - Check BMP, restart HCTZ at 12.5 mg daily. If blood pressure remains over 160/100 in the next 4-5 days, increase to 25 mg daily. Recheck in 2 weeks regardless as she may need additional agents.  - Asymptomatic at present, nonfocal exam. ER/RTC precautions discussed with elevated BP  Dizziness - Plan: Basic metabolic panel Nonintractable episodic headache, unspecified headache type  - Nonfocal exam, not orthostatic. Reports headache and dizziness improved with Klonopin and anxiety management. Expect the symptoms to improve with Klonopin and improving blood pressure. Recheck in 2 weeks. RTC precautions if worse.   Meds ordered this encounter  Medications  . hydrochlorothiazide (MICROZIDE) 12.5 MG capsule  Sig: Take 1 capsule (12.5 mg total) by mouth daily.    Dispense:  90 capsule    Refill:  1  . clonazePAM (KLONOPIN) 0.5 MG tablet    Sig: TAKE 1/2- 1 TABLET BY MOUTH TWICE DAILY AS NEEDED FOR ANXIETY    Dispense:  30 tablet    Refill:  0   Patient Instructions    For blood pressure, will restart hydrochlorothiazide once per day, but return in next 2 weeks with home readings as you will likely need to be on additional medication. If blood pressure is not under 160/100 in next 4-5 days, then increase to 2 pills per day of the hydrochlorothiazide (total dose 27m).   Return to the clinic or go to the nearest emergency room if any of your symptoms worsen or new symptoms occur.  Exercise with walking or other form of exercise every day as part of the treatment for the anxiety.  I would consider meeting with counselor to make sure you are doing ok from the grieving standpoint, as well as discuss anxiety.[facility] [phone]    Stress and Stress Management Stress is a normal reaction to life events. It is what you feel when life demands more than you are used to or more than you can handle. Some stress can be useful. For example, the stress reaction can help you catch the last bus of the day, study for a test, or meet a deadline at work. But stress that occurs too often or for too long can cause problems. It can affect your emotional health and interfere with relationships and normal daily activities. Too much stress can weaken your immune system and increase your risk for physical illness. If you already have a medical problem, stress can make it worse. What are the causes? All sorts of life events may cause stress. An event that causes stress for one person may not be stressful for another person. Major life events commonly cause stress. These may be positive or negative. Examples include losing your job, moving into a new home, getting married, having a baby, or losing a loved one. Less obvious life events may also cause stress, especially if they occur day after day or in combination. Examples include working long hours, driving in traffic, caring for children, being in debt, or being in a difficult relationship. What are the signs or symptoms? Stress may cause emotional symptoms including, the following:  Anxiety. This is feeling worried, afraid, on edge, overwhelmed, or out of control.  Anger. This is feeling irritated or impatient.  Depression. This is feeling sad, down, helpless, or guilty.  Difficulty focusing, remembering, or making decisions.  Stress may cause physical symptoms, including the following:  Aches and pains. These may affect your head, neck, back, stomach, or other areas of your body.  Tight muscles or clenched jaw.  Low energy or trouble sleeping.  Stress may cause unhealthy behaviors, including the following:  Eating to feel better (overeating) or skipping  meals.  Sleeping too little, too much, or both.  Working too much or putting off tasks (procrastination).  Smoking, drinking alcohol, or using drugs to feel better.  How is this diagnosed? Stress is diagnosed through an assessment by your health care provider. Your health care provider will ask questions about your symptoms and any stressful life events.Your health care provider will also ask about your medical history and may order blood tests or other tests. Certain medical conditions and medicine can cause physical symptoms similar  to stress. Mental illness can cause emotional symptoms and unhealthy behaviors similar to stress. Your health care provider may refer you to a mental health professional for further evaluation. How is this treated? Stress management is the recommended treatment for stress.The goals of stress management are reducing stressful life events and coping with stress in healthy ways. Techniques for reducing stressful life events include the following:  Stress identification. Self-monitor for stress and identify what causes stress for you. These skills may help you to avoid some stressful events.  Time management. Set your priorities, keep a calendar of events, and learn to say "no." These tools can help you avoid making too many commitments.  Techniques for coping with stress include the following:  Rethinking the problem. Try to think realistically about stressful events rather than ignoring them or overreacting. Try to find the positives in a stressful situation rather than focusing on the negatives.  Exercise. Physical exercise can release both physical and emotional tension. The key is to find a form of exercise you enjoy and do it regularly.  Relaxation techniques. These relax the body and mind. Examples include yoga, meditation, tai chi, biofeedback, deep breathing, progressive muscle relaxation, listening to music, being out in nature, journaling, and other  hobbies. Again, the key is to find one or more that you enjoy and can do regularly.  Healthy lifestyle. Eat a balanced diet, get plenty of sleep, and do not smoke. Avoid using alcohol or drugs to relax.  Strong support network. Spend time with family, friends, or other people you enjoy being around.Express your feelings and talk things over with someone you trust.  Counseling or talktherapy with a mental health professional may be helpful if you are having difficulty managing stress on your own. Medicine is typically not recommended for the treatment of stress.Talk to your health care provider if you think you need medicine for symptoms of stress. Follow these instructions at home:  Keep all follow-up visits as directed by your health care provider.  Take all medicines as directed by your health care provider. Contact a health care provider if:  Your symptoms get worse or you start having new symptoms.  You feel overwhelmed by your problems and can no longer manage them on your own. Get help right away if:  You feel like hurting yourself or someone else. This information is not intended to replace advice given to you by your health care provider. Make sure you discuss any questions you have with your health care provider. Document Released: 09/27/2000 Document Revised: 09/09/2015 Document Reviewed: 11/26/2012 Elsevier Interactive Patient Education  2017 [employer].   Hypertension Hypertension, commonly called high blood pressure, is when the force of blood pumping through the arteries is too strong. The arteries are the blood vessels that carry blood from the heart throughout the body. Hypertension forces the heart to work harder to pump blood and may cause arteries to become narrow or stiff. Having untreated or uncontrolled hypertension can cause heart attacks, strokes, kidney disease, and other problems. A blood pressure reading consists of a higher number over a lower number.  Ideally, your blood pressure should be below 120/80. The first ("top") number is called the systolic pressure. It is a measure of the pressure in your arteries as your heart beats. The second ("bottom") number is called the diastolic pressure. It is a measure of the pressure in your arteries as the heart relaxes. What are the causes? The cause of this condition is not known. What increases  the risk? Some risk factors for high blood pressure are under your control. Others are not. Factors you can change  Smoking.  Having type 2 diabetes mellitus, high cholesterol, or both.  Not getting enough exercise or physical activity.  Being overweight.  Having too much fat, sugar, calories, or salt (sodium) in your diet.  Drinking too much alcohol. Factors that are difficult or impossible to change  Having chronic kidney disease.  Having a family history of high blood pressure.  Age. Risk increases with age.  Race. You may be at higher risk if you are African-American.  Gender. Men are at higher risk than women before age 78. After age 68, women are at higher risk than men.  Having obstructive sleep apnea.  Stress. What are the signs or symptoms? Extremely high blood pressure (hypertensive crisis) may cause:  Headache.  Anxiety.  Shortness of breath.  Nosebleed.  Nausea and vomiting.  Severe chest pain.  Jerky movements you cannot control (seizures).  How is this diagnosed? This condition is diagnosed by measuring your blood pressure while you are seated, with your arm resting on a surface. The cuff of the blood pressure monitor will be placed directly against the skin of your upper arm at the level of your heart. It should be measured at least twice using the same arm. Certain conditions can cause a difference in blood pressure between your right and left arms. Certain factors can cause blood pressure readings to be lower or higher than normal (elevated) for a short period of  time:  When your blood pressure is higher when you are in a health care provider's office than when you are at home, this is called white coat hypertension. Most people with this condition do not need medicines.  When your blood pressure is higher at home than when you are in a health care provider's office, this is called masked hypertension. Most people with this condition may need medicines to control blood pressure.  If you have a high blood pressure reading during one visit or you have normal blood pressure with other risk factors:  You may be asked to return on a different day to have your blood pressure checked again.  You may be asked to monitor your blood pressure at home for 1 week or longer.  If you are diagnosed with hypertension, you may have other blood or imaging tests to help your health care provider understand your overall risk for other conditions. How is this treated? This condition is treated by making healthy lifestyle changes, such as eating healthy foods, exercising more, and reducing your alcohol intake. Your health care provider may prescribe medicine if lifestyle changes are not enough to get your blood pressure under control, and if:  Your systolic blood pressure is above 130.  Your diastolic blood pressure is above 80.  Your personal target blood pressure may vary depending on your medical conditions, your age, and other factors. Follow these instructions at home: Eating and drinking  Eat a diet that is high in fiber and potassium, and low in sodium, added sugar, and fat. An example eating plan is called the DASH (Dietary Approaches to Stop Hypertension) diet. To eat this way: ? Eat plenty of fresh fruits and vegetables. Try to fill half of your plate at each meal with fruits and vegetables. ? Eat whole grains, such as whole wheat pasta, brown rice, or whole grain bread. Fill about one quarter of your plate with whole grains. ? Eat or  drink low-fat dairy  products, such as skim milk or low-fat yogurt. ? Avoid fatty cuts of meat, processed or cured meats, and poultry with skin. Fill about one quarter of your plate with lean proteins, such as fish, chicken without skin, beans, eggs, and tofu. ? Avoid premade and processed foods. These tend to be higher in sodium, added sugar, and fat.  Reduce your daily sodium intake. Most people with hypertension should eat less than 1,500 mg of sodium a day.  Limit alcohol intake to no more than 1 drink a day for nonpregnant women and 2 drinks a day for men. One drink equals 12 oz of beer, 5 oz of wine, or 1 oz of hard liquor. Lifestyle  Work with your health care provider to maintain a healthy body weight or to lose weight. Ask what an ideal weight is for you.  Get at least 30 minutes of exercise that causes your heart to beat faster (aerobic exercise) most days of the week. Activities may include walking, swimming, or biking.  Include exercise to strengthen your muscles (resistance exercise), such as pilates or lifting weights, as part of your weekly exercise routine. Try to do these types of exercises for 30 minutes at least 3 days a week.  Do not use any products that contain nicotine or tobacco, such as cigarettes and e-cigarettes. If you need help quitting, ask your health care provider.  Monitor your blood pressure at home as told by your health care provider.  Keep all follow-up visits as told by your health care provider. This is important. Medicines  Take over-the-counter and prescription medicines only as told by your health care provider. Follow directions carefully. Blood pressure medicines must be taken as prescribed.  Do not skip doses of blood pressure medicine. Doing this puts you at risk for problems and can make the medicine less effective.  Ask your health care provider about side effects or reactions to medicines that you should watch for. Contact a health care provider if:  You  think you are having a reaction to a medicine you are taking.  You have headaches that keep coming back (recurring).  You feel dizzy.  You have swelling in your ankles.  You have trouble with your vision. Get help right away if:  You develop a severe headache or confusion.  You have unusual weakness or numbness.  You feel faint.  You have severe pain in your chest or abdomen.  You vomit repeatedly.  You have trouble breathing. Summary  Hypertension is when the force of blood pumping through your arteries is too strong. If this condition is not controlled, it may put you at risk for serious complications.  Your personal target blood pressure may vary depending on your medical conditions, your age, and other factors. For most people, a normal blood pressure is less than 120/80.  Hypertension is treated with lifestyle changes, medicines, or a combination of both. Lifestyle changes include weight loss, eating a healthy, low-sodium diet, exercising more, and limiting alcohol. This information is not intended to replace advice given to you by your health care provider. Make sure you discuss any questions you have with your health care provider. Document Released: 04/03/2005 Document Revised: 03/01/2016 Document Reviewed: 03/01/2016 Elsevier Interactive Patient Education  2018 [employer].   IF you received an x-ray today, you will receive an invoice from [facility]. Please contact [facility] at [phone] with questions or concerns regarding your invoice.   IF you received labwork today, you  will receive an invoice from [facility]. Please contact [facility] at [phone] with questions or concerns regarding your invoice.   Our billing staff will not be able to assist you with questions regarding bills from these companies.  You will be contacted with the lab results as soon as they are available. The fastest way to get your results is to activate your My Chart  account. Instructions are located on the last page of this paperwork. If you have not heard from [place] regarding the results in 2 weeks, please contact this office.       I personally performed the services described in this documentation, which was scribed in my presence. The recorded information has been reviewed and considered for accuracy and completeness, addended by me as needed, and agree with information above.  Signed,   [name], MD [facility].  12/12/16 8:51 AM

## 2016-12-12 LAB — BASIC METABOLIC PANEL
BUN/Creatinine Ratio: 14 (ref 9–23)
BUN: 11 mg/dL (ref 6–20)
CALCIUM: 9.7 mg/dL (ref 8.7–10.2)
CHLORIDE: 104 mmol/L (ref 96–106)
CO2: 21 mmol/L (ref 20–29)
Creatinine, Ser: 0.77 mg/dL (ref 0.57–1.00)
GFR calc non Af Amer: 98 mL/min/{1.73_m2} (ref >59)
GFR, EST AFRICAN AMERICAN: 113 mL/min/{1.73_m2} (ref >59)
GLUCOSE: 85 mg/dL (ref 65–99)
POTASSIUM: 4.7 mmol/L (ref 3.5–5.2)
Sodium: 142 mmol/L (ref 134–144)

## 2016-12-26 ENCOUNTER — Encounter: Payer: Self-pay | Admitting: Family Medicine

## 2016-12-26 ENCOUNTER — Ambulatory Visit (INDEPENDENT_AMBULATORY_CARE_PROVIDER_SITE_OTHER): Payer: BC Managed Care – PPO | Admitting: Family Medicine

## 2016-12-26 VITALS — BP 134/92 | HR 82 | Temp 99.0°F | Resp 17 | Ht 68.0 in | Wt 254.0 lb

## 2016-12-26 DIAGNOSIS — F418 Other specified anxiety disorders: Secondary | ICD-10-CM | POA: Diagnosis not present

## 2016-12-26 DIAGNOSIS — I10 Essential (primary) hypertension: Secondary | ICD-10-CM | POA: Diagnosis not present

## 2016-12-26 NOTE — Patient Instructions (Addendum)
  Great work with weight loss and exercise!  Because you're heading the right direction, I will hold off on any blood pressure medication changes today. Monitor those readings and if remaining over 140/90 in the next month, we may need to temporarily increase HCTZ.  Continue walking/exercise most days of the week.  Klonopin as needed for anxiety, but I think the support groups and exercise may also be helpful. Recheck in 3 months for a physical and we can look at cholesterol at that time.  Happy early Birthday.   IF you received an x-ray today, you will receive an invoice from [facility]. Please contact [facility] at [phone] with questions or concerns regarding your invoice.   IF you received labwork today, you will receive an invoice from [facility]. Please contact [facility] at [phone] with questions or concerns regarding your invoice.   Our billing staff will not be able to assist you with questions regarding bills from these companies.  You will be contacted with the lab results as soon as they are available. The fastest way to get your results is to activate your My Chart account. Instructions are located on the last page of this paperwork. If you have not heard from us regarding the results in 2 weeks, please contact this office.

## 2016-12-26 NOTE — Progress Notes (Signed)
Subjective:    Patient ID: Sandy [name], female    DOB: 1975-09-03, 41 y.o.   MRN: [id number]  HPI Sandy [name] is a 41 y.o. female Presents today for: Chief Complaint  Patient presents with  . Hypertension   Here for follow-up hypertension. Last evaluated 12/11/2016. Blood pressure 183/99 at that time, but had been off of medications. Restarted HCTZ 12.5 mg daily, with follow-up today to determine if additional medication needed. We also discussed situational anxiety at last visit. Home BP 140/90-92.   Increased urination with HCTZ, no other new side effects.   Taking 1/2 klonopin every other day or so with anxiety.  Back walking for exercise and has lost 4 pounds. Plans to meet with support group before deciding about counselor.   Wt Readings from Last 3 Encounters:  12/26/16 254 lb (115.2 kg)  12/11/16 258 lb (117 kg)  09/01/15 259 lb (117.5 kg)   There are no active problems to display for this patient.  Past Medical History:  Diagnosis Date  . Anxiety   . Hypertension    Past Surgical History:  Procedure Laterality Date  . LAPAROSCOPIC GASTRIC BANDING     Allergies  Allergen Reactions  . Ace Inhibitors     Throat closes and lips swell  . Shellfish Allergy    Prior to Admission medications   Medication Sig Start Date End Date Taking? Authorizing Provider  b complex vitamins tablet Take 1 tablet by mouth daily.   Yes [provider]  clonazePAM (KLONOPIN) 0.5 MG tablet TAKE 1/2- 1 TABLET BY MOUTH TWICE DAILY AS NEEDED FOR ANXIETY 12/11/16  Yes Shade FloodGreene, Melo Stauber R, MD  cyanocobalamin 500 MCG tablet Take 500 mcg by mouth daily. Reported on 09/01/2015   Yes [provider]  hydrochlorothiazide (MICROZIDE) 12.5 MG capsule Take 1 capsule (12.5 mg total) by mouth daily. 12/11/16  Yes Shade FloodGreene, Jenet Durio R, MD  Multiple Vitamin (MULTIVITAMIN) tablet Take 1 tablet by mouth daily.   Yes [provider]   Social History   Social History  . Marital  status: Single    Spouse name: N/A  . Number of children: N/A  . Years of education: N/A   Occupational History  . Not on file.   Social History Main Topics  . Smoking status: Never Smoker  . Smokeless tobacco: Never Used  . Alcohol use Yes  . Drug use: No  . Sexual activity: Yes   Other Topics Concern  . Not on file   Social History Narrative  . No narrative on file    Review of Systems  Constitutional: Negative for fatigue and unexpected weight change.  Respiratory: Negative for chest tightness and shortness of breath.   Cardiovascular: Negative for chest pain, palpitations and leg swelling.  Gastrointestinal: Negative for abdominal pain and blood in stool.  Neurological: Negative for dizziness, syncope, light-headedness and headaches.       Objective:   Physical Exam  Constitutional: She is oriented to person, place, and time. She appears well-developed and well-nourished. No distress.  Overweight/obese.   HENT:  Head: Normocephalic and atraumatic.  Eyes: Pupils are equal, round, and reactive to light. Conjunctivae and EOM are normal.  Neck: Carotid bruit is not present.  Cardiovascular: Normal rate, regular rhythm, normal heart sounds and intact distal pulses.   Pulmonary/Chest: Effort normal and breath sounds normal.  Abdominal: Soft. She exhibits no pulsatile midline mass. There is no tenderness.  Neurological: She is alert and oriented to person, place, and time.  Skin: Skin is warm and dry.  Psychiatric: She has a normal mood and affect. Her behavior is normal.  Vitals reviewed.  Vitals:   12/26/16 0806  BP: (!) 134/92  Pulse: 82  Resp: 17  Temp: 99 F (37.2 C)  SpO2: 98%       Assessment & Plan:  Sandy [name] is a 41 y.o. female Essential hypertension  - Improving control, still borderline. However she is exercising with 4 pound weight loss since last visit. Same dose of HCTZ as I expect those numbers to continue to improve with exercise and  weight loss. If remaining elevated, call to discuss changes. Recheck in 3 months for physical  Situational anxiety  - Improving with exercise. Klonopin if needed, support groups through hospice, and consider counseling.  No orders of the defined types were placed in this encounter.  Patient Instructions    Randie Heinz work with weight loss and exercise!  Because you're heading the right direction, I will hold off on any blood pressure medication changes today. Monitor those readings and if remaining over 140/90 in the next month, we may need to temporarily increase HCTZ.  Continue walking/exercise most days of the week.  Klonopin as needed for anxiety, but I think the support groups and exercise may also be helpful. Recheck in 3 months for a physical and we can look at cholesterol at that time.  Happy early Birthday.   IF you received an x-ray today, you will receive an invoice from [facility]. Please contact [facility] at [phone] with questions or concerns regarding your invoice.   IF you received labwork today, you will receive an invoice from [facility]. Please contact [facility] at [phone] with questions or concerns regarding your invoice.   Our billing staff will not be able to assist you with questions regarding bills from these companies.  You will be contacted with the lab results as soon as they are available. The fastest way to get your results is to activate your My Chart account. Instructions are located on the last page of this paperwork. If you have not heard from [place] regarding the results in 2 weeks, please contact this office.       Signed,   Meredith Staggers, MD Primary [facility] at [facility].  12/26/16 8:30 AM

## 2017-03-27 ENCOUNTER — Encounter: Payer: BC Managed Care – PPO | Admitting: Family Medicine

## 2017-08-01 ENCOUNTER — Other Ambulatory Visit: Payer: Self-pay | Admitting: Family Medicine

## 2017-08-01 DIAGNOSIS — I10 Essential (primary) hypertension: Secondary | ICD-10-CM

## 2017-08-01 NOTE — Telephone Encounter (Signed)
Refill of HCTZ and Klonopin  LOV 12/26/16  PCP Dr. Neva SeatGreene  Scheduled appt for Tuesday 08/07/17 at 1640. Pt requesting enough HCTZ until appt.. States can wait on Klonopin. Is out of HCTZ.

## 2017-08-01 NOTE — Telephone Encounter (Signed)
Sent one week of BP medication to pharmacy.   Thanks, Sandy [name]  The other medications will be sent to the provider. Pt will be seen next week.

## 2017-08-04 NOTE — Telephone Encounter (Signed)
Refilled klonopin, but recommend follow up prior to this Rx running out.

## 2017-08-07 ENCOUNTER — Ambulatory Visit: Payer: Self-pay | Admitting: Family Medicine

## 2017-08-14 ENCOUNTER — Ambulatory Visit: Payer: BC Managed Care – PPO | Admitting: Family Medicine

## 2017-08-14 ENCOUNTER — Other Ambulatory Visit: Payer: Self-pay

## 2017-08-14 ENCOUNTER — Encounter: Payer: Self-pay | Admitting: Family Medicine

## 2017-08-14 ENCOUNTER — Telehealth: Payer: Self-pay

## 2017-08-14 VITALS — BP 140/96 | HR 82 | Temp 98.8°F | Ht 66.0 in | Wt 253.6 lb

## 2017-08-14 DIAGNOSIS — F418 Other specified anxiety disorders: Secondary | ICD-10-CM | POA: Diagnosis not present

## 2017-08-14 DIAGNOSIS — I10 Essential (primary) hypertension: Secondary | ICD-10-CM

## 2017-08-14 NOTE — Patient Instructions (Addendum)
I will keep hydrochlorothiazide at same dose for now as increased exercise should help with blood pressure and weight loss (as well as stress and anxiety).   For anxiety symptoms, see info below for stress management as well as support groups. klonpin if needed, but I expect that need will decrease. I would like to follow up with you in 3 months, but please let me know if there are questions in the meantime or if refills needed.   Please schedule a physical for the visit in 3 months.   Thank you for coming in today.   Managing Your Hypertension Hypertension is commonly called high blood pressure. This is when the force of your blood pressing against the walls of your arteries is too strong. Arteries are blood vessels that carry blood from your heart throughout your body. Hypertension forces the heart to work harder to pump blood, and may cause the arteries to become narrow or stiff. Having untreated or uncontrolled hypertension can cause heart attack, stroke, kidney disease, and other problems. What are blood pressure readings? A blood pressure reading consists of a higher number over a lower number. Ideally, your blood pressure should be below 120/80. The first ("top") number is called the systolic pressure. It is a measure of the pressure in your arteries as your heart beats. The second ("bottom") number is called the diastolic pressure. It is a measure of the pressure in your arteries as the heart relaxes. What does my blood pressure reading mean? Blood pressure is classified into four stages. Based on your blood pressure reading, your health care provider may use the following stages to determine what type of treatment you need, if any. Systolic pressure and diastolic pressure are measured in a unit called mm Hg. Normal  Systolic pressure: below 098.  Diastolic pressure: below 80. Elevated  Systolic pressure: 119-147.  Diastolic pressure: below 80. Hypertension stage 1  Systolic  pressure: 829-562.  Diastolic pressure: 13-08. Hypertension stage 2  Systolic pressure: 657 or above.  Diastolic pressure: 90 or above. What health risks are associated with hypertension? Managing your hypertension is an important responsibility. Uncontrolled hypertension can lead to:  A heart attack.  A stroke.  A weakened blood vessel (aneurysm).  Heart failure.  Kidney damage.  Eye damage.  Metabolic syndrome.  Memory and concentration problems.  What changes can I make to manage my hypertension? Hypertension can be managed by making lifestyle changes and possibly by taking medicines. Your health care provider will help you make a plan to bring your blood pressure within a normal range. Eating and drinking  Eat a diet that is high in fiber and potassium, and low in salt (sodium), added sugar, and fat. An example eating plan is called the DASH (Dietary Approaches to Stop Hypertension) diet. To eat this way: ? Eat plenty of fresh fruits and vegetables. Try to fill half of your plate at each meal with fruits and vegetables. ? Eat whole grains, such as whole wheat pasta, brown rice, or whole grain bread. Fill about one quarter of your plate with whole grains. ? Eat low-fat diary products. ? Avoid fatty cuts of meat, processed or cured meats, and poultry with skin. Fill about one quarter of your plate with lean proteins such as fish, chicken without skin, beans, eggs, and tofu. ? Avoid premade and processed foods. These tend to be higher in sodium, added sugar, and fat.  Reduce your daily sodium intake. Most people with hypertension should eat less than 1,500 mg  of sodium a day.  Limit alcohol intake to no more than 1 drink a day for nonpregnant women and 2 drinks a day for men. One drink equals 12 oz of beer, 5 oz of wine, or 1 oz of hard liquor. Lifestyle  Work with your health care provider to maintain a healthy body weight, or to lose weight. Ask what an ideal weight is  for you.  Get at least 30 minutes of exercise that causes your heart to beat faster (aerobic exercise) most days of the week. Activities may include walking, swimming, or biking.  Include exercise to strengthen your muscles (resistance exercise), such as weight lifting, as part of your weekly exercise routine. Try to do these types of exercises for 30 minutes at least 3 days a week.  Do not use any products that contain nicotine or tobacco, such as cigarettes and e-cigarettes. If you need help quitting, ask your health care provider.  Control any long-term (chronic) conditions you have, such as high cholesterol or diabetes. Monitoring  Monitor your blood pressure at home as told by your health care provider. Your personal target blood pressure may vary depending on your medical conditions, your age, and other factors.  Have your blood pressure checked regularly, as often as told by your health care provider. Working with your health care provider  Review all the medicines you take with your health care provider because there may be side effects or interactions.  Talk with your health care provider about your diet, exercise habits, and other lifestyle factors that may be contributing to hypertension.  Visit your health care provider regularly. Your health care provider can help you create and adjust your plan for managing hypertension. Will I need medicine to control my blood pressure? Your health care provider may prescribe medicine if lifestyle changes are not enough to get your blood pressure under control, and if:  Your systolic blood pressure is 130 or higher.  Your diastolic blood pressure is 80 or higher.  Take medicines only as told by your health care provider. Follow the directions carefully. Blood pressure medicines must be taken as prescribed. The medicine does not work as well when you skip doses. Skipping doses also puts you at risk for problems. Contact a health care  provider if:  You think you are having a reaction to medicines you have taken.  You have repeated (recurrent) headaches.  You feel dizzy.  You have swelling in your ankles.  You have trouble with your vision. Get help right away if:  You develop a severe headache or confusion.  You have unusual weakness or numbness, or you feel faint.  You have severe pain in your chest or abdomen.  You vomit repeatedly.  You have trouble breathing. Summary  Hypertension is when the force of blood pumping through your arteries is too strong. If this condition is not controlled, it may put you at risk for serious complications.  Your personal target blood pressure may vary depending on your medical conditions, your age, and other factors. For most people, a normal blood pressure is less than 120/80.  Hypertension is managed by lifestyle changes, medicines, or both. Lifestyle changes include weight loss, eating a healthy, low-sodium diet, exercising more, and limiting alcohol. This information is not intended to replace advice given to you by your health care provider. Make sure you discuss any questions you have with your health care provider. Document Released: 12/27/2011 Document Revised: 03/01/2016 Document Reviewed: 03/01/2016 Elsevier Interactive Patient Education  2018 [place] and Stress Management Stress is a normal reaction to life events. It is what you feel when life demands more than you are used to or more than you can handle. Some stress can be useful. For example, the stress reaction can help you catch the last bus of the day, study for a test, or meet a deadline at work. But stress that occurs too often or for too long can cause problems. It can affect your emotional health and interfere with relationships and normal daily activities. Too much stress can weaken your immune system and increase your risk for physical illness. If you already have a medical problem, stress  can make it worse. What are the causes? All sorts of life events may cause stress. An event that causes stress for one person may not be stressful for another person. Major life events commonly cause stress. These may be positive or negative. Examples include losing your job, moving into a new home, getting married, having a baby, or losing a loved one. Less obvious life events may also cause stress, especially if they occur day after day or in combination. Examples include working long hours, driving in traffic, caring for children, being in debt, or being in a difficult relationship. What are the signs or symptoms? Stress may cause emotional symptoms including, the following:  Anxiety. This is feeling worried, afraid, on edge, overwhelmed, or out of control.  Anger. This is feeling irritated or impatient.  Depression. This is feeling sad, down, helpless, or guilty.  Difficulty focusing, remembering, or making decisions.  Stress may cause physical symptoms, including the following:  Aches and pains. These may affect your head, neck, back, stomach, or other areas of your body.  Tight muscles or clenched jaw.  Low energy or trouble sleeping.  Stress may cause unhealthy behaviors, including the following:  Eating to feel better (overeating) or skipping meals.  Sleeping too little, too much, or both.  Working too much or putting off tasks (procrastination).  Smoking, drinking alcohol, or using drugs to feel better.  How is this diagnosed? Stress is diagnosed through an assessment by your health care provider. Your health care provider will ask questions about your symptoms and any stressful life events.Your health care provider will also ask about your medical history and may order blood tests or other tests. Certain medical conditions and medicine can cause physical symptoms similar to stress. Mental illness can cause emotional symptoms and unhealthy behaviors similar to stress. Your  health care provider may refer you to a mental health professional for further evaluation. How is this treated? Stress management is the recommended treatment for stress.The goals of stress management are reducing stressful life events and coping with stress in healthy ways. Techniques for reducing stressful life events include the following:  Stress identification. Self-monitor for stress and identify what causes stress for you. These skills may help you to avoid some stressful events.  Time management. Set your priorities, keep a calendar of events, and learn to say "no." These tools can help you avoid making too many commitments.  Techniques for coping with stress include the following:  Rethinking the problem. Try to think realistically about stressful events rather than ignoring them or overreacting. Try to find the positives in a stressful situation rather than focusing on the negatives.  Exercise. Physical exercise can release both physical and emotional tension. The key is to find a form of exercise you enjoy and do it regularly.  Relaxation  techniques. These relax the body and mind. Examples include yoga, meditation, tai chi, biofeedback, deep breathing, progressive muscle relaxation, listening to music, being out in nature, journaling, and other hobbies. Again, the key is to find one or more that you enjoy and can do regularly.  Healthy lifestyle. Eat a balanced diet, get plenty of sleep, and do not smoke. Avoid using alcohol or drugs to relax.  Strong support network. Spend time with family, friends, or other people you enjoy being around.Express your feelings and talk things over with someone you trust.  Counseling or talktherapy with a mental health professional may be helpful if you are having difficulty managing stress on your own. Medicine is typically not recommended for the treatment of stress.Talk to your health care provider if you think you need medicine for symptoms of  stress. Follow these instructions at home:  Keep all follow-up visits as directed by your health care provider.  Take all medicines as directed by your health care provider. Contact a health care provider if:  Your symptoms get worse or you start having new symptoms.  You feel overwhelmed by your problems and can no longer manage them on your own. Get help right away if:  You feel like hurting yourself or someone else. This information is not intended to replace advice given to you by your health care provider. Make sure you discuss any questions you have with your health care provider. Document Released: 09/27/2000 Document Revised: 09/09/2015 Document Reviewed: 11/26/2012 Elsevier Interactive Patient Education  2017 [employer].   IF you received an x-ray today, you will receive an invoice from [facility]. Please contact [facility] at [phone] with questions or concerns regarding your invoice.   IF you received labwork today, you will receive an invoice from [place]. Please contact [facility] at [phone] with questions or concerns regarding your invoice.   Our billing staff will not be able to assist you with questions regarding bills from these companies.  You will be contacted with the lab results as soon as they are available. The fastest way to get your results is to activate your My Chart account. Instructions are located on the last page of this paperwork. If you have not heard from [place] regarding the results in 2 weeks, please contact this office.

## 2017-08-14 NOTE — Telephone Encounter (Signed)
I have called the pharmacy and asked if the patient received 15 tab or 30 tabs of the Klonopin. The pt stated that she only received 30 tabs which is a 15 day supply of medication. Dispensed on 08/06/2017.  FYI

## 2017-08-14 NOTE — Progress Notes (Signed)
Subjective:  By signing my name below, I, Sandy [name], attest that this documentation has been prepared under the direction and in the presence of [name], MD. Electronically Signed: Moises [name], [place]. 08/14/2017 , 5:15 PM .  Patient was seen in Room 1 .   Patient ID: Sandy [name], female    DOB: 11-03-1975, 42 y.o.   MRN: [id number] Chief Complaint  Patient presents with  . Chonic Conditions    6 month F/u and refills    HPI Sandy [name] is a 43 y.o. female Here for follow up. Last seen in Sept 2018. She had some [employer] and coffee around 1:00PM today.   HTN BP Readings from Last 3 Encounters:  08/14/17 140/90  12/26/16 (!) 134/92  12/11/16 (!) 183/99   Lab Results  Component Value Date   CREATININE 0.77 12/11/2016   Wt Readings from Last 3 Encounters:  08/14/17 253 lb 9.6 oz (115 kg)  12/26/16 254 lb (115.2 kg)  12/11/16 258 lb (117 kg)   Home BP readings were running in the 140s/90s on HCTZ 12.71m QD. She was exercising with some weight loss at that visit. Decided to continue on HCTZ with home monitoring. Also commended patient on increased walking and exercise.   Patient states she hasn't been going to the gym recently, but still walking more for mental and emotional health. She's participating Zumba classes. She took her last dose yesterday. She hasn't been checking her BP as regularly as she should. She denies chest pain, shortness of breath, lightheadedness, dizziness, blurry vision, or dark tarry stools. She's been urinating more with HCTZ and also drinking more water.    Situational anxiety Patient previously required klonopin half pill every other day. She planned on meeting with a support group for grief with father's death 15 years ago and mother's passing. Discussed counseling last time as well, but she was improving with exercise and stress management. She was continued on klonopin if needed with last prescription for #30 on April 20th,  previous prescription on Aug 2018.   Patient states she didn't meet with a counselor, but does have a support group dealing with grief and loss, as well as having another smaller group with her church to talk about "real life stuff". These support groups have been doing okay for her. She notes she was doing well until the holidays and felt stressed. She started feeling more upset towards the end of March as it was around her mother's birthday. Recently, she's been seeing more Mother's Day commercials, as well as stressed about parents' [place] She is deciding to either sell or rent out the house. She previously took flowers out to her maternal grandmother with her mother in the past on Mother's Day, as tradition. She's been taking klonopin half pill about 2-3 times in a typical week recently.   There are no active problems to display for this patient.  Past Medical History:  Diagnosis Date  . Anxiety   . Hypertension    Past Surgical History:  Procedure Laterality Date  . LAPAROSCOPIC GASTRIC BANDING     Allergies  Allergen Reactions  . Ace Inhibitors     Throat closes and lips swell  . Shellfish Allergy    Prior to Admission medications   Medication Sig Start Date End Date Taking? Authorizing Provider  b complex vitamins tablet Take 1 tablet by mouth daily.    [provider]  clonazePAM (KLONOPIN) 0.5 MG tablet TAKE 1/2 TO 1 TABLET BY  MOUTH TWICE DAILY AS NEEDED 08/04/17   Wendie Agreste, MD  cyanocobalamin 500 MCG tablet Take 500 mcg by mouth daily. Reported on 09/01/2015    [provider]  hydrochlorothiazide (MICROZIDE) 12.5 MG capsule TAKE 1 CAPSULE(12.5 MG) BY MOUTH DAILY 08/01/17   Wendie Agreste, MD  Multiple Vitamin (MULTIVITAMIN) tablet Take 1 tablet by mouth daily.    [provider]   Social History   Socioeconomic History  . Marital status: Single    Spouse name: Not on file  . Number of children: Not on file  . Years of  education: Not on file  . Highest education level: Not on file  Occupational History  . Not on file  Social Needs  . Financial resource strain: Not on file  . Food insecurity:    Worry: Not on file    Inability: Not on file  . Transportation needs:    Medical: Not on file    Non-medical: Not on file  Tobacco Use  . Smoking status: Never Smoker  . Smokeless tobacco: Never Used  Substance and Sexual Activity  . Alcohol use: Yes  . Drug use: No  . Sexual activity: Yes  Lifestyle  . Physical activity:    Days per week: Not on file    Minutes per session: Not on file  . Stress: Not on file  Relationships  . Social connections:    Talks on phone: Not on file    Gets together: Not on file    Attends religious service: Not on file    Active member of club or organization: Not on file    Attends meetings of clubs or organizations: Not on file    Relationship status: Not on file  . Intimate partner violence:    Fear of current or ex partner: Not on file    Emotionally abused: Not on file    Physically abused: Not on file    Forced sexual activity: Not on file  Other Topics Concern  . Not on file  Social History Narrative  . Not on file   Review of Systems  Constitutional: Negative for fatigue and unexpected weight change.  Respiratory: Negative for chest tightness and shortness of breath.   Cardiovascular: Negative for chest pain, palpitations and leg swelling.  Gastrointestinal: Negative for abdominal pain and [name] in stool.  Neurological: Negative for dizziness, syncope, light-headedness and headaches.  Psychiatric/Behavioral: Negative for self-injury and suicidal ideas. The patient is nervous/anxious (situational).        Objective:   Physical Exam  Constitutional: She is oriented to person, place, and time. She appears well-developed and well-nourished.  HENT:  Head: Normocephalic and atraumatic.  Eyes: Pupils are equal, round, and reactive to light. Conjunctivae  and EOM are normal.  Neck: Carotid bruit is not present.  Cardiovascular: Normal rate, regular rhythm, normal heart sounds and intact distal pulses.  Pulmonary/Chest: Effort normal and breath sounds normal.  Abdominal: Soft. She exhibits no pulsatile midline mass. There is no tenderness.  Neurological: She is alert and oriented to person, place, and time.  Skin: Skin is warm and dry.  Psychiatric: She has a normal mood and affect. Her behavior is normal.  Vitals reviewed.   Vitals:   08/14/17 1627  BP: 140/90  Pulse: 82  Temp: 98.8 F (37.1 C)  TempSrc: Oral  SpO2: 99%  Weight: 253 lb 9.6 oz (115 kg)  Height: 5' 6"  (1.676 m)       Assessment & Plan:  Sandy [name] is a 42 y.o. female Essential hypertension - Plan: Basic metabolic panel  -Borderline control, but decided to remain on same dose of HCTZ for now as her increase exercise should help with both [name] pressure and weight loss.  Check BMP, then follow-up next few months for physical for fasting labs  Situational anxiety  -Appears to be overall managing her stress/situational anxiety symptoms well.  Klonopin was recently refilled for breakthrough symptoms.  Continue exercise as above and other stress management techniques.  If persistent use of Klonopin, would recommend individual counseling, or possible SSRI. No orders of the defined types were placed in this encounter.  Patient Instructions   I will keep hydrochlorothiazide at same dose for now as increased exercise should help with [name] pressure and weight loss (as well as stress and anxiety).   For anxiety symptoms, see info below for stress management as well as support groups. klonpin if needed, but I expect that need will decrease. I would like to follow up with you in 3 months, but please let me know if there are questions in the meantime or if refills needed.   Please schedule a physical for the visit in 3 months.   Thank you for coming in today.    Managing Your Hypertension Hypertension is commonly called high [name] pressure. This is when the force of your [name] pressing against the walls of your arteries is too strong. Arteries are [name] vessels that carry [name] from your heart throughout your body. Hypertension forces the heart to work harder to pump [name], and may cause the arteries to become narrow or stiff. Having untreated or uncontrolled hypertension can cause heart attack, stroke, kidney disease, and other problems. What are [name] pressure readings? A [name] pressure reading consists of a higher number over a lower number. Ideally, your [name] pressure should be below 120/80. The first ("top") number is called the systolic pressure. It is a measure of the pressure in your arteries as your heart beats. The second ("bottom") number is called the diastolic pressure. It is a measure of the pressure in your arteries as the heart relaxes. What does my [name] pressure reading mean? [name] pressure is classified into four stages. Based on your [name] pressure reading, your health care provider may use the following stages to determine what type of treatment you need, if any. Systolic pressure and diastolic pressure are measured in a unit called mm Hg. Normal  Systolic pressure: below 174.  Diastolic pressure: below 80. Elevated  Systolic pressure: 944-967.  Diastolic pressure: below 80. Hypertension stage 1  Systolic pressure: 591-638.  Diastolic pressure: 46-65. Hypertension stage 2  Systolic pressure: 993 or above.  Diastolic pressure: 90 or above. What health risks are associated with hypertension? Managing your hypertension is an important responsibility. Uncontrolled hypertension can lead to:  A heart attack.  A stroke.  A weakened [name] vessel (aneurysm).  Heart failure.  Kidney damage.  Eye damage.  Metabolic syndrome.  Memory and concentration problems.  What changes can I make to manage my  hypertension? Hypertension can be managed by making lifestyle changes and possibly by taking medicines. Your health care provider will help you make a plan to bring your [name] pressure within a normal range. Eating and drinking  Eat a diet that is high in fiber and potassium, and low in salt (sodium), added sugar, and fat. An example eating plan is called the DASH (Dietary Approaches to Stop Hypertension) diet. To eat this way: ? Eat plenty  of fresh fruits and vegetables. Try to fill half of your plate at each meal with fruits and vegetables. ? Eat whole grains, such as whole wheat pasta, brown rice, or whole grain bread. Fill about one quarter of your plate with whole grains. ? Eat low-fat diary products. ? Avoid fatty cuts of meat, processed or cured meats, and poultry with skin. Fill about one quarter of your plate with lean proteins such as fish, chicken without skin, beans, eggs, and tofu. ? Avoid premade and processed foods. These tend to be higher in sodium, added sugar, and fat.  Reduce your daily sodium intake. Most people with hypertension should eat less than 1,500 mg of sodium a day.  Limit alcohol intake to no more than 1 drink a day for nonpregnant women and 2 drinks a day for men. One drink equals 12 oz of beer, 5 oz of wine, or 1 oz of hard liquor. Lifestyle  Work with your health care provider to maintain a healthy body weight, or to lose weight. Ask what an ideal weight is for you.  Get at least 30 minutes of exercise that causes your heart to beat faster (aerobic exercise) most days of the week. Activities may include walking, swimming, or biking.  Include exercise to strengthen your muscles (resistance exercise), such as weight lifting, as part of your weekly exercise routine. Try to do these types of exercises for 30 minutes at least 3 days a week.  Do not use any products that contain nicotine or tobacco, such as cigarettes and e-cigarettes. If you need help quitting, ask  your health care provider.  Control any long-term (chronic) conditions you have, such as high cholesterol or diabetes. Monitoring  Monitor your [name] pressure at home as told by your health care provider. Your personal target [name] pressure may vary depending on your medical conditions, your age, and other factors.  Have your [name] pressure checked regularly, as often as told by your health care provider. Working with your health care provider  Review all the medicines you take with your health care provider because there may be side effects or interactions.  Talk with your health care provider about your diet, exercise habits, and other lifestyle factors that may be contributing to hypertension.  Visit your health care provider regularly. Your health care provider can help you create and adjust your plan for managing hypertension. Will I need medicine to control my [name] pressure? Your health care provider may prescribe medicine if lifestyle changes are not enough to get your [name] pressure under control, and if:  Your systolic [name] pressure is 130 or higher.  Your diastolic [name] pressure is 80 or higher.  Take medicines only as told by your health care provider. Follow the directions carefully. [name] pressure medicines must be taken as prescribed. The medicine does not work as well when you skip doses. Skipping doses also puts you at risk for problems. Contact a health care provider if:  You think you are having a reaction to medicines you have taken.  You have repeated (recurrent) headaches.  You feel dizzy.  You have swelling in your ankles.  You have trouble with your vision. Get help right away if:  You develop a severe headache or confusion.  You have unusual weakness or numbness, or you feel faint.  You have severe pain in your chest or abdomen.  You vomit repeatedly.  You have trouble breathing. Summary  Hypertension is when the force of [name] pumping through  your  arteries is too strong. If this condition is not controlled, it may put you at risk for serious complications.  Your personal target [name] pressure may vary depending on your medical conditions, your age, and other factors. For most people, a normal [name] pressure is less than 120/80.  Hypertension is managed by lifestyle changes, medicines, or both. Lifestyle changes include weight loss, eating a healthy, low-sodium diet, exercising more, and limiting alcohol. This information is not intended to replace advice given to you by your health care provider. Make sure you discuss any questions you have with your health care provider. Document Released: 12/27/2011 Document Revised: 03/01/2016 Document Reviewed: 03/01/2016 Elsevier Interactive Patient Education  2018 [place] and Stress Management Stress is a normal reaction to life events. It is what you feel when life demands more than you are used to or more than you can handle. Some stress can be useful. For example, the stress reaction can help you catch the last bus of the day, study for a test, or meet a deadline at work. But stress that occurs too often or for too long can cause problems. It can affect your emotional health and interfere with relationships and normal daily activities. Too much stress can weaken your immune system and increase your risk for physical illness. If you already have a medical problem, stress can make it worse. What are the causes? All sorts of life events may cause stress. An event that causes stress for one person may not be stressful for another person. Major life events commonly cause stress. These may be positive or negative. Examples include losing your job, moving into a new home, getting married, having a baby, or losing a loved one. Less obvious life events may also cause stress, especially if they occur day after day or in combination. Examples include working long hours, driving in traffic,  caring for children, being in debt, or being in a difficult relationship. What are the signs or symptoms? Stress may cause emotional symptoms including, the following:  Anxiety. This is feeling worried, afraid, on edge, overwhelmed, or out of control.  Anger. This is feeling irritated or impatient.  Depression. This is feeling sad, down, helpless, or guilty.  Difficulty focusing, remembering, or making decisions.  Stress may cause physical symptoms, including the following:  Aches and pains. These may affect your head, neck, back, stomach, or other areas of your body.  Tight muscles or clenched jaw.  Low energy or trouble sleeping.  Stress may cause unhealthy behaviors, including the following:  Eating to feel better (overeating) or skipping meals.  Sleeping too little, too much, or both.  Working too much or putting off tasks (procrastination).  Smoking, drinking alcohol, or using drugs to feel better.  How is this diagnosed? Stress is diagnosed through an assessment by your health care provider. Your health care provider will ask questions about your symptoms and any stressful life events.Your health care provider will also ask about your medical history and may order [name] tests or other tests. Certain medical conditions and medicine can cause physical symptoms similar to stress. Mental illness can cause emotional symptoms and unhealthy behaviors similar to stress. Your health care provider may refer you to a mental health professional for further evaluation. How is this treated? Stress management is the recommended treatment for stress.The goals of stress management are reducing stressful life events and coping with stress in healthy ways. Techniques for reducing stressful life events include the following:  Stress identification.  Self-monitor for stress and identify what causes stress for you. These skills may help you to avoid some stressful events.  Time management. Set  your priorities, keep a calendar of events, and learn to say "no." These tools can help you avoid making too many commitments.  Techniques for coping with stress include the following:  Rethinking the problem. Try to think realistically about stressful events rather than ignoring them or overreacting. Try to find the positives in a stressful situation rather than focusing on the negatives.  Exercise. Physical exercise can release both physical and emotional tension. The key is to find a form of exercise you enjoy and do it regularly.  Relaxation techniques. These relax the body and mind. Examples include yoga, meditation, tai chi, biofeedback, deep breathing, progressive muscle relaxation, listening to music, being out in nature, journaling, and other hobbies. Again, the key is to find one or more that you enjoy and can do regularly.  Healthy lifestyle. Eat a balanced diet, get plenty of sleep, and do not smoke. Avoid using alcohol or drugs to relax.  Strong support network. Spend time with family, friends, or other people you enjoy being around.Express your feelings and talk things over with someone you trust.  Counseling or talktherapy with a mental health professional may be helpful if you are having difficulty managing stress on your own. Medicine is typically not recommended for the treatment of stress.Talk to your health care provider if you think you need medicine for symptoms of stress. Follow these instructions at home:  Keep all follow-up visits as directed by your health care provider.  Take all medicines as directed by your health care provider. Contact a health care provider if:  Your symptoms get worse or you start having new symptoms.  You feel overwhelmed by your problems and can no longer manage them on your own. Get help right away if:  You feel like hurting yourself or someone else. This information is not intended to replace advice given to you by your health care  provider. Make sure you discuss any questions you have with your health care provider. Document Released: 09/27/2000 Document Revised: 09/09/2015 Document Reviewed: 11/26/2012 Elsevier Interactive Patient Education  2017 [employer].   IF you received an x-ray today, you will receive an invoice from [facility]. Please contact [facility] at [phone] with questions or concerns regarding your invoice.   IF you received labwork today, you will receive an invoice from [facility]. Please contact [facility] at [phone] with questions or concerns regarding your invoice.   Our billing staff will not be able to assist you with questions regarding bills from these companies.  You will be contacted with the lab results as soon as they are available. The fastest way to get your results is to activate your My Chart account. Instructions are located on the last page of this paperwork. If you have not heard from [place] regarding the results in 2 weeks, please contact this office.       I personally performed the services described in this documentation, which was scribed in my presence. The recorded information has been reviewed and considered for accuracy and completeness, addended by me as needed, and agree with information above.  Signed,   [name], MD [facility].  08/15/17 10:09 PM

## 2017-08-15 ENCOUNTER — Encounter: Payer: Self-pay | Admitting: Family Medicine

## 2017-08-15 ENCOUNTER — Other Ambulatory Visit: Payer: Self-pay | Admitting: Family Medicine

## 2017-08-15 DIAGNOSIS — I10 Essential (primary) hypertension: Secondary | ICD-10-CM

## 2017-08-15 LAB — BASIC METABOLIC PANEL
BUN / CREAT RATIO: 12 (ref 9–23)
BUN: 9 mg/dL (ref 6–20)
CO2: 23 mmol/L (ref 20–29)
CREATININE: 0.73 mg/dL (ref 0.57–1.00)
Calcium: 9.3 mg/dL (ref 8.7–10.2)
Chloride: 101 mmol/L (ref 96–106)
GFR calc Af Amer: 120 mL/min/{1.73_m2} (ref >59)
GFR, EST NON AFRICAN AMERICAN: 104 mL/min/{1.73_m2} (ref >59)
Glucose: 86 mg/dL (ref 65–99)
Potassium: 4 mmol/L (ref 3.5–5.2)
Sodium: 138 mmol/L (ref 134–144)

## 2017-08-15 MED ORDER — HYDROCHLOROTHIAZIDE 12.5 MG PO CAPS: 12.5000 mg | ORAL_CAPSULE | Freq: Every day | ORAL | 1 refills | Status: DC

## 2017-08-16 MED ORDER — HYDROCHLOROTHIAZIDE 12.5 MG PO CAPS: 12.5000 mg | ORAL_CAPSULE | Freq: Every day | ORAL | 1 refills | Status: DC

## 2017-08-16 NOTE — Telephone Encounter (Signed)
HCTZ sent to pharmacy. Okay per last office visit note   Clydene Pugh

## 2017-11-15 ENCOUNTER — Encounter: Payer: BC Managed Care – PPO | Admitting: Family Medicine

## 2018-06-27 ENCOUNTER — Other Ambulatory Visit: Payer: Self-pay | Admitting: Family Medicine

## 2018-06-27 NOTE — Telephone Encounter (Signed)
Requested medication (s) are due for refill today: yes  Requested medication (s) are on the active medication list: yes    Last refill: 08/13/2017  #30 0 refills  Future visit scheduled no  Notes to clinic:not delegated  Requested Prescriptions  Pending Prescriptions Disp Refills   clonazePAM (KLONOPIN) 0.5 MG tablet [Pharmacy Med Name: CLONAZEPAM 0.5MG  TABLETS] 30 tablet     Sig: TAKE 1/2 TO 1 TABLET BY MOUTH TWICE DAILY AS NEEDED     Not Delegated - Psychiatry:  Anxiolytics/Hypnotics Failed - 06/27/2018  2:33 PM      Failed - This refill cannot be delegated      Failed - Urine Drug Screen completed in last 360 days.      Failed - Valid encounter within last 6 months    Recent Outpatient Visits          10 months ago Essential hypertension   [facility], Asencion Partridge, MD   1 year ago Essential hypertension   [facility], Asencion Partridge, MD   1 year ago Situational stress   [facility], Asencion Partridge, MD   2 years ago Throat pain   [facility], [place] D, [place]   3 years ago Essential hypertension   [facility], Asencion Partridge, MD

## 2018-09-26 ENCOUNTER — Other Ambulatory Visit: Payer: Self-pay | Admitting: Family Medicine

## 2018-09-26 DIAGNOSIS — I10 Essential (primary) hypertension: Secondary | ICD-10-CM

## 2019-02-18 ENCOUNTER — Other Ambulatory Visit: Payer: Self-pay | Admitting: Family Medicine

## 2019-02-18 DIAGNOSIS — I10 Essential (primary) hypertension: Secondary | ICD-10-CM

## 2019-02-21 ENCOUNTER — Other Ambulatory Visit: Payer: Self-pay

## 2019-02-21 ENCOUNTER — Encounter: Payer: Self-pay | Admitting: Family Medicine

## 2019-02-21 ENCOUNTER — Ambulatory Visit: Payer: BC Managed Care – PPO | Admitting: Family Medicine

## 2019-02-21 VITALS — BP 171/115 | HR 103 | Temp 98.5°F | Wt 237.8 lb

## 2019-02-21 DIAGNOSIS — I10 Essential (primary) hypertension: Secondary | ICD-10-CM

## 2019-02-21 DIAGNOSIS — Z13220 Encounter for screening for lipoid disorders: Secondary | ICD-10-CM | POA: Diagnosis not present

## 2019-02-21 DIAGNOSIS — Z1329 Encounter for screening for other suspected endocrine disorder: Secondary | ICD-10-CM

## 2019-02-21 DIAGNOSIS — F418 Other specified anxiety disorders: Secondary | ICD-10-CM | POA: Diagnosis not present

## 2019-02-21 MED ORDER — CLONAZEPAM 0.5 MG PO TABS: ORAL_TABLET | ORAL | 0 refills | Status: DC

## 2019-02-21 MED ORDER — HYDROCHLOROTHIAZIDE 25 MG PO TABS: 25.0000 mg | ORAL_TABLET | Freq: Every day | ORAL | 1 refills | Status: DC

## 2019-02-21 NOTE — Patient Instructions (Addendum)
Try higher dose of HCTZ once per day. Keep a record of your blood pressures outside of the office and bring them to the next office visit.  Keep up with low intensity exercise daily if possible for stress management, and counseling as needed. Klonopin refilled for now. Recheck in 1 month.    Managing Your Hypertension Hypertension is commonly called high blood pressure. This is when the force of your blood pressing against the walls of your arteries is too strong. Arteries are blood vessels that carry blood from your heart throughout your body. Hypertension forces the heart to work harder to pump blood, and may cause the arteries to become narrow or stiff. Having untreated or uncontrolled hypertension can cause heart attack, stroke, kidney disease, and other problems. What are blood pressure readings? A blood pressure reading consists of a higher number over a lower number. Ideally, your blood pressure should be below 120/80. The first ("top") number is called the systolic pressure. It is a measure of the pressure in your arteries as your heart beats. The second ("bottom") number is called the diastolic pressure. It is a measure of the pressure in your arteries as the heart relaxes. What does my blood pressure reading mean? Blood pressure is classified into four stages. Based on your blood pressure reading, your health care provider may use the following stages to determine what type of treatment you need, if any. Systolic pressure and diastolic pressure are measured in a unit called mm Hg. Normal  Systolic pressure: below 120.  Diastolic pressure: below 80. Elevated  Systolic pressure: 120-129.  Diastolic pressure: below 80. Hypertension stage 1  Systolic pressure: 130-139.  Diastolic pressure: 80-89. Hypertension stage 2  Systolic pressure: 140 or above.  Diastolic pressure: 90 or above. What health risks are associated with hypertension? Managing your hypertension is an  important responsibility. Uncontrolled hypertension can lead to:  A heart attack.  A stroke.  A weakened blood vessel (aneurysm).  Heart failure.  Kidney damage.  Eye damage.  Metabolic syndrome.  Memory and concentration problems. What changes can I make to manage my hypertension? Hypertension can be managed by making lifestyle changes and possibly by taking medicines. Your health care provider will help you make a plan to bring your blood pressure within a normal range. Eating and drinking   Eat a diet that is high in fiber and potassium, and low in salt (sodium), added sugar, and fat. An example eating plan is called the DASH (Dietary Approaches to Stop Hypertension) diet. To eat this way: ? Eat plenty of fresh fruits and vegetables. Try to fill half of your plate at each meal with fruits and vegetables. ? Eat whole grains, such as whole wheat pasta, brown rice, or whole grain bread. Fill about one quarter of your plate with whole grains. ? Eat low-fat diary products. ? Avoid fatty cuts of meat, processed or cured meats, and poultry with skin. Fill about one quarter of your plate with lean proteins such as fish, chicken without skin, beans, eggs, and tofu. ? Avoid premade and processed foods. These tend to be higher in sodium, added sugar, and fat.  Reduce your daily sodium intake. Most people with hypertension should eat less than 1,500 mg of sodium a day.  Limit alcohol intake to no more than 1 drink a day for nonpregnant women and 2 drinks a day for men. One drink equals 12 oz of beer, 5 oz of wine, or 1 oz of hard liquor. Lifestyle  Work with your health care provider to maintain a healthy body weight, or to lose weight. Ask what an ideal weight is for you.  Get at least 30 minutes of exercise that causes your heart to beat faster (aerobic exercise) most days of the week. Activities may include walking, swimming, or biking.  Include exercise to strengthen your muscles  (resistance exercise), such as weight lifting, as part of your weekly exercise routine. Try to do these types of exercises for 30 minutes at least 3 days a week.  Do not use any products that contain nicotine or tobacco, such as cigarettes and e-cigarettes. If you need help quitting, ask your health care provider.  Control any long-term (chronic) conditions you have, such as high cholesterol or diabetes. Monitoring  Monitor your blood pressure at home as told by your health care provider. Your personal target blood pressure may vary depending on your medical conditions, your age, and other factors.  Have your blood pressure checked regularly, as often as told by your health care provider. Working with your health care provider  Review all the medicines you take with your health care provider because there may be side effects or interactions.  Talk with your health care provider about your diet, exercise habits, and other lifestyle factors that may be contributing to hypertension.  Visit your health care provider regularly. Your health care provider can help you create and adjust your plan for managing hypertension. Will I need medicine to control my blood pressure? Your health care provider may prescribe medicine if lifestyle changes are not enough to get your blood pressure under control, and if:  Your systolic blood pressure is 130 or higher.  Your diastolic blood pressure is 80 or higher. Take medicines only as told by your health care provider. Follow the directions carefully. Blood pressure medicines must be taken as prescribed. The medicine does not work as well when you skip doses. Skipping doses also puts you at risk for problems. Contact a health care provider if:  You think you are having a reaction to medicines you have taken.  You have repeated (recurrent) headaches.  You feel dizzy.  You have swelling in your ankles.  You have trouble with your vision. Get help right  away if:  You develop a severe headache or confusion.  You have unusual weakness or numbness, or you feel faint.  You have severe pain in your chest or abdomen.  You vomit repeatedly.  You have trouble breathing. Summary  Hypertension is when the force of blood pumping through your arteries is too strong. If this condition is not controlled, it may put you at risk for serious complications.  Your personal target blood pressure may vary depending on your medical conditions, your age, and other factors. For most people, a normal blood pressure is less than 120/80.  Hypertension is managed by lifestyle changes, medicines, or both. Lifestyle changes include weight loss, eating a healthy, low-sodium diet, exercising more, and limiting alcohol. This information is not intended to replace advice given to you by your health care provider. Make sure you discuss any questions you have with your health care provider. Document Released: 12/27/2011 Document Revised: 07/26/2018 Document Reviewed: 03/01/2016 Elsevier Patient Education  [employer].   If you have lab work done today you will be contacted with your lab results within the next 2 weeks.  If you have not heard from [place] then please contact us. The fastest way to get your results is to register  for My Chart.   IF you received an x-ray today, you will receive an invoice from [facility]. Please contact [facility] at [phone] with questions or concerns regarding your invoice.   IF you received labwork today, you will receive an invoice from [facility]. Please contact [facility] at [phone] with questions or concerns regarding your invoice.   Our billing staff will not be able to assist you with questions regarding bills from these companies.  You will be contacted with the lab results as soon as they are available. The fastest way to get your results is to activate your My Chart account. Instructions are located  on the last page of this paperwork. If you have not heard from us regarding the results in 2 weeks, please contact this office.

## 2019-02-21 NOTE — Progress Notes (Signed)
Subjective:    Patient ID: Sandy [name], female    DOB: 1976-03-29, 43 y.o.   MRN: [id number]  HPI Sandy [name] is a 43 y.o. female Presents today for: Chief Complaint  Patient presents with   Medication Refill    patient need refills on her med. She has been out of her Bp meds for a few days now and bp has been running high.gad7=8 phq9=6   Hypertension: Complicated by medication nonadherence.  Has been off meds for past few days.  Previously treated with hydrochlorothiazide 12.5 mg daily.  She was last seen in April 2019, and discussed follow-up in 3 months for physical at that time. Today is anniversary of mother's passing.  When on HCTZ, home BP 138/80's as lowest, but usually in 140/90's.  No side effects.   BP Readings from Last 3 Encounters:  02/21/19 (!) 171/115  08/14/17 (!) 140/96  12/26/16 (!) 134/92   Lab Results  Component Value Date   CREATININE 0.73 08/14/2017   Last po at 7:30am  Situational anxiety: Last discussed in April 2019.  Situational anxiety with grief with death of her parents previously.  Treated with Klonopin as needed at that time, and reported half pill 2-3 times per week usage at that time.  Information provided for support groups as well as handout on stress management and 64-month follow-up recommended last April. Since last visit has not had clonazepam refill since April 2019.  Controlled substance database (PDMP) reviewed. No concerns appreciated.  Has support system with counseling agency. 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  Decreased walking with time change.  No recent klonopin.  Sleeping ok. Clusters of anxiety, then doing well. No manic sx's.  No SI/HI.   Depression screen [facility] 2/9 02/21/2019 08/14/2017 12/26/2016 12/11/2016 09/01/2015  Decreased Interest 2 0 0 0 0  Down, Depressed, Hopeless 2 0 0 1 0  PHQ - 2 Score 4 0 0 1 0  Altered sleeping 0 - - - -  Tired, decreased  energy 1 - - - -  Change in appetite 1 - - - -  Feeling bad or failure about yourself  0 - - - -  Trouble concentrating 0 - - - -  Moving slowly or fidgety/restless 0 - - - -  Suicidal thoughts 0 - - - -  PHQ-9 Score 6 - - - -   GAD 7 : Generalized Anxiety Score 02/21/2019  Nervous, Anxious, on Edge 2  Control/stop worrying 2  Worry too much - different things 1  Trouble relaxing 1  Restless 1  Easily annoyed or irritable 1  Afraid - awful might happen 0  Total GAD 7 Score 8       There are no active problems to display for this patient.  Past Medical History:  Diagnosis Date   Anxiety    Hypertension    Past Surgical History:  Procedure Laterality Date   LAPAROSCOPIC GASTRIC BANDING     Allergies  Allergen Reactions   Ace Inhibitors     Throat closes and lips swell   Shellfish Allergy    Prior to Admission medications   Medication Sig Start Date End Date Taking? Authorizing Provider  clonazePAM (KLONOPIN) 0.5 MG tablet TAKE 1/2 TO 1 TABLET BY MOUTH TWICE DAILY AS NEEDED 08/04/17  Yes Shade Flood, MD  hydrochlorothiazide (MICROZIDE) 12.5 MG capsule Take 1 capsule (12.5 mg total) by mouth daily. 08/16/17  Yes Meredith Staggers  R, MD  latanoprost (XALATAN) 0.005 % ophthalmic solution INT 1 GTT IN OU QD HS 08/01/17  Yes [provider]  Multiple Vitamin (MULTIVITAMIN) tablet Take 1 tablet by mouth daily.   Yes [provider]   Social History   Socioeconomic History   Marital status: Single    Spouse name: Not on file   Number of children: Not on file   Years of education: Not on file   Highest education level: Not on file  Occupational History   Not on file  Social Needs   Financial resource strain: Not on file   Food insecurity    Worry: Not on file    Inability: Not on file   Transportation needs    Medical: Not on file    Non-medical: Not on file  Tobacco Use   Smoking status: Never Smoker   Smokeless tobacco: Never  Used  Substance and Sexual Activity   Alcohol use: Yes   Drug use: No   Sexual activity: Yes  Lifestyle   Physical activity    Days per week: Not on file    Minutes per session: Not on file   Stress: Not on file  Relationships   Social connections    Talks on phone: Not on file    Gets together: Not on file    Attends religious service: Not on file    Active member of club or organization: Not on file    Attends meetings of clubs or organizations: Not on file    Relationship status: Not on file   Intimate partner violence    Fear of current or ex partner: Not on file    Emotionally abused: Not on file    Physically abused: Not on file    Forced sexual activity: Not on file  Other Topics Concern   Not on file  Social History Narrative   Not on file    Review of Systems  Constitutional: Negative for fatigue and unexpected weight change.  HENT: Positive for sneezing.   Respiratory: Negative for chest tightness and shortness of breath.   Cardiovascular: Negative for chest pain, palpitations and leg swelling.  Gastrointestinal: Negative for abdominal pain and blood in stool.  Neurological: Negative for dizziness (only when anxious, resolves with breathing. ), syncope, light-headedness and headaches.       Objective:   Physical Exam Vitals signs reviewed.  Constitutional:      Appearance: She is well-developed.  HENT:     Head: Normocephalic and atraumatic.  Eyes:     Conjunctiva/sclera: Conjunctivae normal.     Pupils: Pupils are equal, round, and reactive to light.  Neck:     Vascular: No carotid bruit.  Cardiovascular:     Rate and Rhythm: Normal rate and regular rhythm.     Heart sounds: Normal heart sounds.  Pulmonary:     Effort: Pulmonary effort is normal.     Breath sounds: Normal breath sounds.  Abdominal:     Palpations: Abdomen is soft. There is no pulsatile mass.     Tenderness: There is no abdominal tenderness.  Skin:    General: Skin is  warm and dry.  Neurological:     Mental Status: She is alert and oriented to person, place, and time.  Psychiatric:        Behavior: Behavior normal.    Vitals:   02/21/19 1115 02/21/19 1121  BP: (!) 178/127 (!) 171/115  Pulse: (!) 103   Temp: 98.5 F (36.9  C)   TempSrc: Oral   SpO2: 99%   Weight: 237 lb 12.8 oz (107.9 kg)        Assessment & Plan:   Sandy [name] is a [age] y.o. female Essential hypertension - Plan: hydrochlorothiazide (HYDRODIURIL) 25 MG tablet, Comprehensive metabolic panel Screening for thyroid disorder - Plan: TSH  - decreased control off meds, elevated prior. Asymptomatic.   - increase HCTZ 25mg  qd, recheck 1 month.   - rtc precautions.   Screening for hyperlipidemia - Plan: Lipid panel   Situational anxiety - Plan: clonazePAM (KLONOPIN) 0.5 MG tablet  - continue stress mgt techniques, counseling, klonopin as needed. recheck in 1 month.   Meds ordered this encounter  Medications   clonazePAM (KLONOPIN) 0.5 MG tablet    Sig: TAKE 1/2 TO 1 TABLET BY MOUTH TWICE DAILY AS NEEDED    Dispense:  30 tablet    Refill:  0   hydrochlorothiazide (HYDRODIURIL) 25 MG tablet    Sig: Take 1 tablet (25 mg total) by mouth daily.    Dispense:  90 tablet    Refill:  1   Patient Instructions     Try higher dose of HCTZ once per day. Keep a record of your blood pressures outside of the office and bring them to the next office visit.  Keep up with low intensity exercise daily if possible for stress management, and counseling as needed. Klonopin refilled for now. Recheck in 1 month.    Managing Your Hypertension Hypertension is commonly called high blood pressure. This is when the force of your blood pressing against the walls of your arteries is too strong. Arteries are blood vessels that carry blood from your heart throughout your body. Hypertension forces the heart to work harder to pump blood, and may cause the arteries to become narrow or stiff. Having  untreated or uncontrolled hypertension can cause heart attack, stroke, kidney disease, and other problems. What are blood pressure readings? A blood pressure reading consists of a higher number over a lower number. Ideally, your blood pressure should be below 120/80. The first ("top") number is called the systolic pressure. It is a measure of the pressure in your arteries as your heart beats. The second ("bottom") number is called the diastolic pressure. It is a measure of the pressure in your arteries as the heart relaxes. What does my blood pressure reading mean? Blood pressure is classified into four stages. Based on your blood pressure reading, your health care provider may use the following stages to determine what type of treatment you need, if any. Systolic pressure and diastolic pressure are measured in a unit called mm Hg. Normal  Systolic pressure: below 120.  Diastolic pressure: below 80. Elevated  Systolic pressure: 120-129.  Diastolic pressure: below 80. Hypertension stage 1  Systolic pressure: 130-139.  Diastolic pressure: 80-89. Hypertension stage 2  Systolic pressure: 140 or above.  Diastolic pressure: 90 or above. What health risks are associated with hypertension? Managing your hypertension is an important responsibility. Uncontrolled hypertension can lead to:  A heart attack.  A stroke.  A weakened blood vessel (aneurysm).  Heart failure.  Kidney damage.  Eye damage.  Metabolic syndrome.  Memory and concentration problems. What changes can I make to manage my hypertension? Hypertension can be managed by making lifestyle changes and possibly by taking medicines. Your health care provider will help you make a plan to bring your blood pressure within a normal range. Eating and drinking   Eat a diet that  is high in fiber and potassium, and low in salt (sodium), added sugar, and fat. An example eating plan is called the DASH (Dietary Approaches to Stop  Hypertension) diet. To eat this way: ? Eat plenty of fresh fruits and vegetables. Try to fill half of your plate at each meal with fruits and vegetables. ? Eat whole grains, such as whole wheat pasta, brown rice, or whole grain bread. Fill about one quarter of your plate with whole grains. ? Eat low-fat diary products. ? Avoid fatty cuts of meat, processed or cured meats, and poultry with skin. Fill about one quarter of your plate with lean proteins such as fish, chicken without skin, beans, eggs, and tofu. ? Avoid premade and processed foods. These tend to be higher in sodium, added sugar, and fat.  Reduce your daily sodium intake. Most people with hypertension should eat less than 1,500 mg of sodium a day.  Limit alcohol intake to no more than 1 drink a day for nonpregnant women and 2 drinks a day for men. One drink equals 12 oz of beer, 5 oz of wine, or 1 oz of hard liquor. Lifestyle  Work with your health care provider to maintain a healthy body weight, or to lose weight. Ask what an ideal weight is for you.  Get at least 30 minutes of exercise that causes your heart to beat faster (aerobic exercise) most days of the week. Activities may include walking, swimming, or biking.  Include exercise to strengthen your muscles (resistance exercise), such as weight lifting, as part of your weekly exercise routine. Try to do these types of exercises for 30 minutes at least 3 days a week.  Do not use any products that contain nicotine or tobacco, such as cigarettes and e-cigarettes. If you need help quitting, ask your health care provider.  Control any long-term (chronic) conditions you have, such as high cholesterol or diabetes. Monitoring  Monitor your blood pressure at home as told by your health care provider. Your personal target blood pressure may vary depending on your medical conditions, your age, and other factors.  Have your blood pressure checked regularly, as often as told by your  health care provider. Working with your health care provider  Review all the medicines you take with your health care provider because there may be side effects or interactions.  Talk with your health care provider about your diet, exercise habits, and other lifestyle factors that may be contributing to hypertension.  Visit your health care provider regularly. Your health care provider can help you create and adjust your plan for managing hypertension. Will I need medicine to control my blood pressure? Your health care provider may prescribe medicine if lifestyle changes are not enough to get your blood pressure under control, and if:  Your systolic blood pressure is 130 or higher.  Your diastolic blood pressure is 80 or higher. Take medicines only as told by your health care provider. Follow the directions carefully. Blood pressure medicines must be taken as prescribed. The medicine does not work as well when you skip doses. Skipping doses also puts you at risk for problems. Contact a health care provider if:  You think you are having a reaction to medicines you have taken.  You have repeated (recurrent) headaches.  You feel dizzy.  You have swelling in your ankles.  You have trouble with your vision. Get help right away if:  You develop a severe headache or confusion.  You have unusual weakness or numbness,  or you feel faint.  You have severe pain in your chest or abdomen.  You vomit repeatedly.  You have trouble breathing. Summary  Hypertension is when the force of blood pumping through your arteries is too strong. If this condition is not controlled, it may put you at risk for serious complications.  Your personal target blood pressure may vary depending on your medical conditions, your age, and other factors. For most people, a normal blood pressure is less than 120/80.  Hypertension is managed by lifestyle changes, medicines, or both. Lifestyle changes include weight  loss, eating a healthy, low-sodium diet, exercising more, and limiting alcohol. This information is not intended to replace advice given to you by your health care provider. Make sure you discuss any questions you have with your health care provider. Document Released: 12/27/2011 Document Revised: 07/26/2018 Document Reviewed: 03/01/2016 Elsevier Patient Education  [employer].   If you have lab work done today you will be contacted with your lab results within the next 2 weeks.  If you have not heard from [place] then please contact us. The fastest way to get your results is to register for My Chart.   IF you received an x-ray today, you will receive an invoice from [facility]. Please contact [facility] at [phone] with questions or concerns regarding your invoice.   IF you received labwork today, you will receive an invoice from [place]. Please contact [facility] at [phone] with questions or concerns regarding your invoice.   Our billing staff will not be able to assist you with questions regarding bills from these companies.  You will be contacted with the lab results as soon as they are available. The fastest way to get your results is to activate your My Chart account. Instructions are located on the last page of this paperwork. If you have not heard from [place] regarding the results in 2 weeks, please contact this office.       Signed,   Meredith Staggers, MD [facility].  02/21/19 12:59 PM

## 2019-02-22 LAB — COMPREHENSIVE METABOLIC PANEL
ALT: 10 IU/L (ref 0–32)
AST: 19 IU/L (ref 0–40)
Albumin/Globulin Ratio: 1.5 (ref 1.2–2.2)
Albumin: 4.3 g/dL (ref 3.8–4.8)
Alkaline Phosphatase: 68 IU/L (ref 39–117)
BUN/Creatinine Ratio: 13 (ref 9–23)
BUN: 9 mg/dL (ref 6–24)
Bilirubin Total: 1.4 mg/dL — ABNORMAL HIGH (ref 0.0–1.2)
CO2: 19 mmol/L — ABNORMAL LOW (ref 20–29)
Calcium: 9.1 mg/dL (ref 8.7–10.2)
Chloride: 103 mmol/L (ref 96–106)
Creatinine, Ser: 0.71 mg/dL (ref 0.57–1.00)
GFR calc Af Amer: 122 mL/min/{1.73_m2} (ref >59)
GFR calc non Af Amer: 106 mL/min/{1.73_m2} (ref >59)
Globulin, Total: 2.8 g/dL (ref 1.5–4.5)
Glucose: 60 mg/dL — ABNORMAL LOW (ref 65–99)
Potassium: 4.4 mmol/L (ref 3.5–5.2)
Sodium: 139 mmol/L (ref 134–144)
Total Protein: 7.1 g/dL (ref 6.0–8.5)

## 2019-02-22 LAB — LIPID PANEL
Chol/HDL Ratio: 2.6 ratio (ref 0.0–4.4)
Cholesterol, Total: 224 mg/dL — ABNORMAL HIGH (ref 100–199)
HDL: 85 mg/dL (ref >39)
LDL Chol Calc (NIH): 127 mg/dL — ABNORMAL HIGH (ref 0–99)
Triglycerides: 71 mg/dL (ref 0–149)
VLDL Cholesterol Cal: 12 mg/dL (ref 5–40)

## 2019-02-22 LAB — TSH: TSH: 2 u[IU]/mL (ref 0.450–4.500)

## 2019-03-26 ENCOUNTER — Ambulatory Visit: Payer: BC Managed Care – PPO | Admitting: Family Medicine

## 2019-03-26 ENCOUNTER — Encounter: Payer: Self-pay | Admitting: Family Medicine

## 2019-03-26 ENCOUNTER — Other Ambulatory Visit: Payer: Self-pay

## 2019-03-26 VITALS — BP 142/90 | HR 101 | Temp 98.0°F | Wt 229.8 lb

## 2019-03-26 DIAGNOSIS — I10 Essential (primary) hypertension: Secondary | ICD-10-CM | POA: Diagnosis not present

## 2019-03-26 DIAGNOSIS — F418 Other specified anxiety disorders: Secondary | ICD-10-CM

## 2019-03-26 MED ORDER — CLONAZEPAM 0.5 MG PO TABS: ORAL_TABLET | ORAL | 0 refills | Status: DC

## 2019-03-26 MED ORDER — AMLODIPINE BESYLATE 2.5 MG PO TABS: 2.5000 mg | ORAL_TABLET | Freq: Every day | ORAL | 1 refills | Status: DC

## 2019-03-26 NOTE — Progress Notes (Signed)
Subjective:  Patient ID: Sandy [name], female    DOB: Dec 14, 1975  Age: 43 y.o. MRN: [id number]  CC:  Chief Complaint  Patient presents with  . Follow-up    1 month f/u on depression and bp. PHQ9=12 and GAD7=13    HPI Sandy [name] presents for   Hypertension: Complicated by medication nonadherence, last seen November 6.  Off meds for few days at that time, previously only on HCTZ 12.5 mg daily.  Did report some slight elevated readings on meds at home.  HCTZ increased to 25 mg daily.  No new side effects of HCTZ. Initial incr urination - ok now. No missed doses.  Home readings: few readings - 150/100.  BP Readings from Last 3 Encounters:  03/26/19 (!) 142/90  02/21/19 (!) 171/115  08/14/17 (!) 140/96   Lab Results  Component Value Date   CREATININE 0.71 02/21/2019   Situational anxiety Discussed at November 6 visit.  Anniversary of her mother's passing at that time.  Using breathing exercises, walking daily for stress management in the past, decreased with time change at last visit.  Reinforced stress management techniques,.  Klonopin was prescribed as needed. Taking about 1/2 pill every other day with stress of holidays. Initially took 1/2 BID.  Meeting with therapist in past, not since last visit.  Some walking, exercises at home.  Controlled substance database (PDMP) reviewed. No concerns appreciated.  Last filled #30 Klonopin on 11/6.  No SI.  GAD 7 : Generalized Anxiety Score 03/26/2019 02/21/2019  Nervous, Anxious, on Edge 2 2  Control/stop worrying 2 2  Worry too much - different things 3 1  Trouble relaxing 2 1  Restless 1 1  Easily annoyed or irritable 2 1  Afraid - awful might happen 1 0  Total GAD 7 Score 13 8   PHQ9 SCORE ONLY 03/26/2019 02/21/2019 08/14/2017  Score 12 6 0   Depression screen [facility] 2/9 03/26/2019 02/21/2019 08/14/2017 12/26/2016 12/11/2016  Decreased Interest 2 2 0 0 0  Down, Depressed, Hopeless 2 2 0 0 1  PHQ - 2 Score 4 4 0 0 1  Altered  sleeping 2 0 - - -  Tired, decreased energy 1 1 - - -  Change in appetite 1 1 - - -  Feeling bad or failure about yourself  2 0 - - -  Trouble concentrating 1 0 - - -  Moving slowly or fidgety/restless 1 0 - - -  Suicidal thoughts 0 0 - - -  PHQ-9 Score 12 6 - - -    History There are no active problems to display for this patient.  Past Medical History:  Diagnosis Date  . Anxiety   . Hypertension    Past Surgical History:  Procedure Laterality Date  . LAPAROSCOPIC GASTRIC BANDING     Allergies  Allergen Reactions  . Ace Inhibitors     Throat closes and lips swell  . Shellfish Allergy    Prior to Admission medications   Medication Sig Start Date End Date Taking? Authorizing Provider  clonazePAM (KLONOPIN) 0.5 MG tablet TAKE 1/2 TO 1 TABLET BY MOUTH TWICE DAILY AS NEEDED 02/21/19  Yes Shade Flood, MD  hydrochlorothiazide (HYDRODIURIL) 25 MG tablet Take 1 tablet (25 mg total) by mouth daily. 02/21/19  Yes Shade Flood, MD  latanoprost (XALATAN) 0.005 % ophthalmic solution INT 1 GTT IN OU QD HS 08/01/17  Yes [provider]  Multiple Vitamin (MULTIVITAMIN) tablet Take 1 tablet by mouth daily.  Yes [provider]   Social History   Socioeconomic History  . Marital status: Single    Spouse name: Not on file  . Number of children: Not on file  . Years of education: Not on file  . Highest education level: Not on file  Occupational History  . Not on file  Social Needs  . Financial resource strain: Not on file  . Food insecurity    Worry: Not on file    Inability: Not on file  . Transportation needs    Medical: Not on file    Non-medical: Not on file  Tobacco Use  . Smoking status: Never Smoker  . Smokeless tobacco: Never Used  Substance and Sexual Activity  . Alcohol use: Yes  . Drug use: No  . Sexual activity: Yes  Lifestyle  . Physical activity    Days per week: Not on file    Minutes per session: Not on file  . Stress: Not on file   Relationships  . Social Herbalist on phone: Not on file    Gets together: Not on file    Attends religious service: Not on file    Active member of club or organization: Not on file    Attends meetings of clubs or organizations: Not on file    Relationship status: Not on file  . Intimate partner violence    Fear of current or ex partner: Not on file    Emotionally abused: Not on file    Physically abused: Not on file    Forced sexual activity: Not on file  Other Topics Concern  . Not on file  Social History Narrative  . Not on file    Review of Systems  Constitutional: Negative for fatigue and unexpected weight change.  Eyes: Negative for visual disturbance.  Respiratory: Negative for chest tightness and shortness of breath.   Cardiovascular: Negative for chest pain, palpitations and leg swelling.  Gastrointestinal: Negative for abdominal pain and blood in stool.  Neurological: Negative for dizziness, syncope, light-headedness and headaches.     Objective:   Vitals:   03/26/19 0831 03/26/19 0837  BP: (!) 160/107 (!) 142/90  Pulse: (!) 101   Temp: 98 F (36.7 C)   TempSrc: Temporal   SpO2: 100%   Weight: 229 lb 12.8 oz (104.2 kg)      Physical Exam Vitals signs reviewed.  Constitutional:      Appearance: She is well-developed.  HENT:     Head: Normocephalic and atraumatic.  Eyes:     Conjunctiva/sclera: Conjunctivae normal.     Pupils: Pupils are equal, round, and reactive to light.  Neck:     Vascular: No carotid bruit.  Cardiovascular:     Rate and Rhythm: Normal rate and regular rhythm.     Heart sounds: Normal heart sounds.  Pulmonary:     Effort: Pulmonary effort is normal.     Breath sounds: Normal breath sounds.  Abdominal:     Palpations: Abdomen is soft. There is no pulsatile mass.     Tenderness: There is no abdominal tenderness.  Skin:    General: Skin is warm and dry.  Neurological:     Mental Status: She is alert and oriented  to person, place, and time.  Psychiatric:        Mood and Affect: Mood normal.        Behavior: Behavior normal.        Thought Content: Thought content normal.  Assessment & Plan:  Alver SorrowJessica [name] is a 43 y.o. female . Essential hypertension  -Improved but still decreased control.  Continue HCTZ 25 mg daily, add amlodipine 2.5 mg daily-side effects discussed.  Recheck 3 months.  Situational anxiety - Plan: clonazePAM (KLONOPIN) 0.5 MG tablet  -Some flare of symptoms through holidays.  Discussed SSRI.  Declined at this time, plans to meet with counselor.  Continue exercise.  Would consider SSRI if frequent need for benzodiazepine.   Meds ordered this encounter  Medications  . clonazePAM (KLONOPIN) 0.5 MG tablet    Sig: TAKE 1/2 TO 1 TABLET BY MOUTH TWICE DAILY AS NEEDED    Dispense:  30 tablet    Refill:  0  . amLODipine (NORVASC) 2.5 MG tablet    Sig: Take 1 tablet (2.5 mg total) by mouth daily.    Dispense:  90 tablet    Refill:  1   Patient Instructions   Add amlodpine once per day for blood pressure. Continue hydrochlorothiazide same dose. Recheck in 3 months.   I would consider starting an SSRI if still requiring klonopin frequently or presistent anxiety.  Meet with therapist as planned, and keep up with exercise as that also helps with stress/anxiety.   If you have lab work done today you will be contacted with your lab results within the next 2 weeks.  If you have not heard from us then please contact us. The fastest way to get your results is to register for My Chart.   IF you received an x-ray today, you will receive an invoice from [facility]. Please contact [facility] at [phone] with questions or concerns regarding your invoice.   IF you received labwork today, you will receive an invoice from [facility]. Please contact [facility] at [phone] with questions or concerns regarding your invoice.   Our billing staff will not be able to  assist you with questions regarding bills from these companies.  You will be contacted with the lab results as soon as they are available. The fastest way to get your results is to activate your My Chart account. Instructions are located on the last page of this paperwork. If you have not heard from us regarding the results in 2 weeks, please contact this office.          Signed, Meredith StaggersJeffrey Lonnette Shrode, MD [facility]

## 2019-03-26 NOTE — Patient Instructions (Addendum)
Add amlodpine once per day for blood pressure. Continue hydrochlorothiazide same dose. Recheck in 3 months.   I would consider starting an SSRI if still requiring klonopin frequently or presistent anxiety.  Meet with therapist as planned, and keep up with exercise as that also helps with stress/anxiety.   If you have lab work done today you will be contacted with your lab results within the next 2 weeks.  If you have not heard from [place] then please contact us. The fastest way to get your results is to register for My Chart.   IF you received an x-ray today, you will receive an invoice from [facility]. Please contact [facility] at [phone] with questions or concerns regarding your invoice.   IF you received labwork today, you will receive an invoice from [facility]. Please contact [facility] at [phone] with questions or concerns regarding your invoice.   Our billing staff will not be able to assist you with questions regarding bills from these companies.  You will be contacted with the lab results as soon as they are available. The fastest way to get your results is to activate your My Chart account. Instructions are located on the last page of this paperwork. If you have not heard from [place] regarding the results in 2 weeks, please contact this office.

## 2019-06-25 ENCOUNTER — Ambulatory Visit: Payer: BC Managed Care – PPO | Admitting: Family Medicine

## 2019-06-25 ENCOUNTER — Other Ambulatory Visit: Payer: Self-pay

## 2019-06-25 ENCOUNTER — Encounter: Payer: Self-pay | Admitting: Family Medicine

## 2019-06-25 VITALS — BP 170/108 | HR 115 | Temp 94.4°F | Ht 66.0 in | Wt 231.0 lb

## 2019-06-25 DIAGNOSIS — F411 Generalized anxiety disorder: Secondary | ICD-10-CM | POA: Diagnosis not present

## 2019-06-25 DIAGNOSIS — I10 Essential (primary) hypertension: Secondary | ICD-10-CM

## 2019-06-25 DIAGNOSIS — F418 Other specified anxiety disorders: Secondary | ICD-10-CM | POA: Diagnosis not present

## 2019-06-25 MED ORDER — AMLODIPINE BESYLATE 5 MG PO TABS: 5.0000 mg | ORAL_TABLET | Freq: Every day | ORAL | 1 refills | Status: DC

## 2019-06-25 MED ORDER — CLONAZEPAM 0.5 MG PO TABS: ORAL_TABLET | ORAL | 0 refills | Status: DC

## 2019-06-25 MED ORDER — HYDROCHLOROTHIAZIDE 25 MG PO TABS: 25.0000 mg | ORAL_TABLET | Freq: Every day | ORAL | 1 refills | Status: DC

## 2019-06-25 MED ORDER — SERTRALINE HCL 25 MG PO TABS: 25.0000 mg | ORAL_TABLET | Freq: Every day | ORAL | 1 refills | Status: DC

## 2019-06-25 NOTE — Patient Instructions (Addendum)
  Increase to 2 pills of amlodipine for total 5mg . I will send a new prescription as well.  Continue hctz same dose for now. Keep a record of your blood pressures outside of the office and bring them to the next office visit. Return to the clinic or go to the nearest emergency room if any of your symptoms worsen or new symptoms occur.  I do recommend starting zoloft once per day. recheck in 3 weeks. I do recommend individual therapist as well as continuing  Group counseling.   Here are a few options for counseling:  [facility]:  [phone]  [facility] [phone]     If you have lab work done today you will be contacted with your lab results within the next 2 weeks.  If you have not heard from [phone] then please contact us. The fastest way to get your results is to register for My Chart.   IF you received an x-ray today, you will receive an invoice from [facility]. Please contact [facility] at [phone] with questions or concerns regarding your invoice.   IF you received labwork today, you will receive an invoice from [place]. Please contact [facility] at [phone] with questions or concerns regarding your invoice.   Our billing staff will not be able to assist you with questions regarding bills from these companies.  You will be contacted with the lab results as soon as they are available. The fastest way to get your results is to activate your My Chart account. Instructions are located on the last page of this paperwork. If you have not heard from [phone] regarding the results in 2 weeks, please contact this office.

## 2019-06-25 NOTE — Progress Notes (Addendum)
Subjective:  Patient ID: Sandy [name], female    DOB: 07/19/1975  Age: 44 y.o. MRN: [id number]  CC:  Chief Complaint  Patient presents with  . Follow-up    on anxiety and hypertension. pt states she hasn't has any issues with her BP since last visit. pt clames her BP is slowly coming down with the help of her medication. pt clames her medication seems to be working well with no side effects. pt isn't ecperancing any physical symptoms. pt clames her anxiety has improved a lot since last visit as well. pt states she feels like she has it under control more than she did last time we saw her.    HPI Sandy [name] presents for   Hypertension: Currently on amlodipine 2.5 mg daily.  Hydrochlorothiazide 25 mg daily.  Amlodipine was added in December for elevated readings. No missed doses.  Home readings: 146/90 at chiropractor, not checking at home.  BP Readings from Last 3 Encounters:  06/25/19 (!) 152/102  03/26/19 (!) 142/90  02/21/19 (!) 171/115   Lab Results  Component Value Date   CREATININE 0.71 02/21/2019   Situational anxiety Discussed in December, suspected situational anxiety, flare symptoms through the holidays.  SSRI was discussed but declined in favor of meeting with counselor instead.  Was continuing exercise as coping techniques as well.  Short-term benzodiazepine with Klonopin if needed for breakthrough symptoms. Feeling anxious here - being in doctors office.  Doing ok overall. Some anxiety but feels like managing better. Group counseling only for grief. .  Has not met with individual counselor yet.  Still would like to avoid SSRI.  Has not taken klonopin in 2 weeks - 1/2 one this am.  Has 5 left form 03/26/19.  Stressed most days of the week. Anxiety attacks - last one a month ago.  GAD 7 : Generalized Anxiety Score 06/25/2019 03/26/2019 02/21/2019  Nervous, Anxious, on Edge 3 2 2   Control/stop worrying 2 2 2   Worry too much - different things 2 3 1   Trouble  relaxing 1 2 1   Restless 0 1 1  Easily annoyed or irritable 1 2 1   Afraid - awful might happen 1 1 0  Total GAD 7 Score 10 13 8    Depression screen [facility] 2/9 06/25/2019 03/26/2019 02/21/2019 08/14/2017 12/26/2016  Decreased Interest 0 2 2 0 0  Down, Depressed, Hopeless 0 2 2 0 0  PHQ - 2 Score 0 4 4 0 0  Altered sleeping - 2 0 - -  Tired, decreased energy - 1 1 - -  Change in appetite - 1 1 - -  Feeling bad or failure about yourself  - 2 0 - -  Trouble concentrating - 1 0 - -  Moving slowly or fidgety/restless - 1 0 - -  Suicidal thoughts - 0 0 - -  PHQ-9 Score - 12 6 - -       History There are no problems to display for this patient.  Past Medical History:  Diagnosis Date  . Anxiety   . Hypertension    Past Surgical History:  Procedure Laterality Date  . LAPAROSCOPIC GASTRIC BANDING     Allergies  Allergen Reactions  . Ace Inhibitors     Throat closes and lips swell  . Shellfish Allergy    Prior to Admission medications   Medication Sig Start Date End Date Taking? Authorizing Provider  amLODipine (NORVASC) 2.5 MG tablet Take 1 tablet (2.5 mg total) by mouth daily. 03/26/19  Yes Wendie Agreste, MD  clonazePAM (KLONOPIN) 0.5 MG tablet TAKE 1/2 TO 1 TABLET BY MOUTH TWICE DAILY AS NEEDED 03/26/19  Yes Wendie Agreste, MD  hydrochlorothiazide (HYDRODIURIL) 25 MG tablet Take 1 tablet (25 mg total) by mouth daily. 02/21/19  Yes Wendie Agreste, MD  latanoprost (XALATAN) 0.005 % ophthalmic solution INT 1 GTT IN OU QD HS 08/01/17  Yes [provider]  Multiple Vitamin (MULTIVITAMIN) tablet Take 1 tablet by mouth daily.   Yes [provider]   Social History   Socioeconomic History  . Marital status: Single    Spouse name: Not on file  . Number of children: Not on file  . Years of education: Not on file  . Highest education level: Not on file  Occupational History  . Not on file  Tobacco Use  . Smoking status: Never Smoker  . Smokeless tobacco: Never  Used  Substance and Sexual Activity  . Alcohol use: Yes  . Drug use: No  . Sexual activity: Yes  Other Topics Concern  . Not on file  Social History Narrative  . Not on file   Social Determinants of Health   Financial Resource Strain:   . Difficulty of Paying Living Expenses: Not on file  Food Insecurity:   . Worried About Charity fundraiser in the Last Year: Not on file  . Ran Out of Food in the Last Year: Not on file  Transportation Needs:   . Lack of Transportation (Medical): Not on file  . Lack of Transportation (Non-Medical): Not on file  Physical Activity:   . Days of Exercise per Week: Not on file  . Minutes of Exercise per Session: Not on file  Stress:   . Feeling of Stress : Not on file  Social Connections:   . Frequency of Communication with Friends and Family: Not on file  . Frequency of Social Gatherings with Friends and Family: Not on file  . Attends Religious Services: Not on file  . Active Member of Clubs or Organizations: Not on file  . Attends Archivist Meetings: Not on file  . Marital Status: Not on file  Intimate Partner Violence:   . Fear of Current or Ex-Partner: Not on file  . Emotionally Abused: Not on file  . Physically Abused: Not on file  . Sexually Abused: Not on file    Review of Systems  Constitutional: Negative for fatigue and unexpected weight change.  Respiratory: Negative for chest tightness and shortness of breath.   Cardiovascular: Negative for chest pain, palpitations and leg swelling.  Gastrointestinal: Negative for abdominal pain and blood in stool.  Neurological: Negative for dizziness, syncope, light-headedness and headaches (less with adding amlodipine. ).     Objective:   Vitals:   06/25/19 0854  BP: (!) 152/102  Pulse: (!) 124  Temp: (!) 94.4 F (34.7 C)  TempSrc: Temporal  SpO2: 100%  Weight: 231 lb (104.8 kg)  Height: 5' 6"  (1.676 m)     Physical Exam Vitals reviewed.  Constitutional:       Appearance: She is well-developed.  HENT:     Head: Normocephalic and atraumatic.  Eyes:     Conjunctiva/sclera: Conjunctivae normal.     Pupils: Pupils are equal, round, and reactive to light.  Neck:     Vascular: No carotid bruit.  Cardiovascular:     Rate and Rhythm: Normal rate and regular rhythm.     Heart sounds: Normal heart sounds.  Pulmonary:  Effort: Pulmonary effort is normal.     Breath sounds: Normal breath sounds.  Abdominal:     Palpations: Abdomen is soft. There is no pulsatile mass.     Tenderness: There is no abdominal tenderness.  Musculoskeletal:     Right lower leg: No edema.     Left lower leg: No edema.  Skin:    General: Skin is warm and dry.  Neurological:     Mental Status: She is alert and oriented to person, place, and time.  Psychiatric:        Behavior: Behavior normal.     Assessment & Plan:  Sandy [name] is a 44 y.o. female . Essential hypertension - Plan: amLODipine (NORVASC) 5 MG tablet, hydrochlorothiazide (HYDRODIURIL) 25 MG tablet  - decreased control. Possible anxiety component.  Increase norvasc to 58m , continue HCTZ 230mQD. Recheck 3 weeks with possible labs at that time.   Anxiety state - Plan: sertraline (ZOLOFT) 25 MG tablet Situational anxiety - Plan: clonazePAM (KLONOPIN) 0.5 MG tablet  - persistent anxiety. Start zoloft 25 mg daily, potential side effects discussed.  Importance of individual counseling again recommended, but also ok to continue grief counseling in the group setting.  Phone numbers provided.  Recheck 3 weeks.  No orders of the defined types were placed in this encounter.  Patient Instructions    Increase to 2 pills of amlodipine for total 14m82mI will send a new prescription as well.  Continue hctz same dose for now. I do recommend starting zoloft once per day. recheck in 3 weeks. I do recommend individual therapist as well as continuing  Group counseling.   Here are a few options for  counseling:  [facility]:  [phone]  If you have lab work done today you will be contacted with your lab results within the next 2 weeks.  If you have not heard from us [place] please contact us.[place] fastest way to get your results is to register for My Chart.   IF you received an x-ray today, you will receive an invoice from [facility]. Please contact [facility] at [phone] questions or concerns regarding your invoice.   IF you received labwork today, you will receive an invoice from [facility] at [phone] questions or concerns regarding your invoice.   Our billing staff will not be able to assist you with questions regarding bills from these companies.  You will be contacted with the lab results as soon as they are available. The fastest way to get your results is to activate your My Chart account. Instructions are located on the last page of this paperwork. If you have not heard from us [place] the results in 2 weeks, please contact this office.         Signed, JefMerri RayD [facility]

## 2019-07-23 ENCOUNTER — Encounter: Payer: Self-pay | Admitting: Family Medicine

## 2019-07-23 ENCOUNTER — Other Ambulatory Visit: Payer: Self-pay

## 2019-07-23 ENCOUNTER — Ambulatory Visit: Payer: BC Managed Care – PPO | Admitting: Family Medicine

## 2019-07-23 VITALS — BP 138/92 | HR 145 | Temp 98.0°F | Ht 66.0 in | Wt 225.0 lb

## 2019-07-23 DIAGNOSIS — E162 Hypoglycemia, unspecified: Secondary | ICD-10-CM

## 2019-07-23 DIAGNOSIS — F4310 Post-traumatic stress disorder, unspecified: Secondary | ICD-10-CM | POA: Diagnosis not present

## 2019-07-23 DIAGNOSIS — F411 Generalized anxiety disorder: Secondary | ICD-10-CM

## 2019-07-23 DIAGNOSIS — I10 Essential (primary) hypertension: Secondary | ICD-10-CM

## 2019-07-23 MED ORDER — BUSPIRONE HCL 5 MG PO TABS: 5.0000 mg | ORAL_TABLET | Freq: Two times a day (BID) | ORAL | 1 refills | Status: DC

## 2019-07-23 NOTE — Patient Instructions (Addendum)
  Keep a record of your blood pressures outside of the office daily. If reading over 140/90 - increase amlodipine to total 10mg  and let me know so I can send in a new prescription.   Try buspar twice per day for anxiety, but counsleing should help. I do think there is a component of PTSD.  Let me know how BuSpar works and we can refill medicines until next visit or increase dose if needed.  I will check blood sugar on electrolytes today, but if you have access to a blood sugar meter, let me know what those readings are when you are having panic attack symptoms in the morning.  Recheck 3 months Return to the clinic or go to the nearest emergency room if any of your symptoms worsen or new symptoms occur.   If you have lab work done today you will be contacted with your lab results within the next 2 weeks.  If you have not heard from then please contact us. The fastest way to get your results is to register for My Chart.   IF you received an x-ray today, you will receive an invoice from [facility]. Please contact [facility] at [phone] with questions or concerns regarding your invoice.   IF you received labwork today, you will receive an invoice from [place]. Please contact [facility] at [phone] with questions or concerns regarding your invoice.   Our billing staff will not be able to assist you with questions regarding bills from these companies.  You will be contacted with the lab results as soon as they are available. The fastest way to get your results is to activate your My Chart account. Instructions are located on the last page of this paperwork. If you have not heard from [phone] regarding the results in 2 weeks, please contact this office.

## 2019-07-23 NOTE — Progress Notes (Signed)
Subjective:  Patient ID: Sandy [name], female    DOB: [birth year]  Age: 44 y.o. MRN: [id number]  CC:  Chief Complaint  Patient presents with  . Hypertension    pt states at home she feels fine, but pt hasnt beens checking her BP at home due to anxiety. pt has been taking her medication as prescribed. pt hasn't expressed any physical symptoms of hypertension. pt is seeing a counsler for anxiety.  . Anxiety    pt has a counser lined up, but pt is still vary anxious when coming to the doctor's office.    HPI Sandy [name] presents for   Hypertension: Currently taking hydrochlorothiazide 25 mg daily.  Amlodipine 2.5 mg daily added in December, increased to 5 mg at last visit March 10.  Does report anxiety with being in medical office. Scared to check home readings - worried that might have high number.  No missed doses of meds.  Home readings:none.  BP Readings from Last 3 Encounters:  07/23/19 (!) 138/92  06/25/19 (!) 170/108  03/26/19 (!) 142/90   Lab Results  Component Value Date   CREATININE 0.71 02/21/2019    Anxiety Most recent discussed March 10, started on Zoloft 25 mg daily, had been attending group counseling but importance of individual counseling discussed and now is followed by individual counselor.  Has Klonopin if needed for breakthrough anxiety.  Now followed by 2 individual counselors - trying to decide who will be best fit. Next appt this Friday.  Anxious with Medical offices - mom passed away same day after being in [place] office in 2017.  Has not discussed PTSD yet with counselors (only 1 visit each).  Loud sounds will startle. Tried zoloft -took for 3 weeks. Felt more anxious on meds - even after few weeks. Felt like needed klonopin more.  Would like to hold on further SSRI for now.  Cut back on wine - 1 per week now.  Feels like panic attack in am, better with eating. Glucose 60 on labs 02/21/19. Normal in 2019.   GAD 7 : Generalized Anxiety Score  07/23/2019 06/25/2019 03/26/2019 02/21/2019  Nervous, Anxious, on Edge 1 3 2 2   Control/stop worrying 1 2 2 2   Worry too much - different things 1 2 3 1   Trouble relaxing 2 1 2 1   Restless 1 0 1 1  Easily annoyed or irritable 2 1 2 1   Afraid - awful might happen 1 1 1  0  Total GAD 7 Score 9 10 13 8    Depression screen [facility] 2/9 07/23/2019 06/25/2019 03/26/2019 02/21/2019 08/14/2017  Decreased Interest 0 0 2 2 0  Down, Depressed, Hopeless 0 0 2 2 0  PHQ - 2 Score 0 0 4 4 0  Altered sleeping - - 2 0 -  Tired, decreased energy - - 1 1 -  Change in appetite - - 1 1 -  Feeling bad or failure about yourself  - - 2 0 -  Trouble concentrating - - 1 0 -  Moving slowly or fidgety/restless - - 1 0 -  Suicidal thoughts - - 0 0 -  PHQ-9 Score - - 12 6 -     Hyperlipidemia: Not on meds. Has changed diet. Not fasting  Lab Results  Component Value Date   CHOL 224 (H) 02/21/2019   HDL 85 02/21/2019   LDLCALC 127 (H) 02/21/2019   TRIG 71 02/21/2019   CHOLHDL 2.6 02/21/2019   Lab Results  Component Value Date  ALT 10 02/21/2019   AST 19 02/21/2019   ALKPHOS 68 02/21/2019   BILITOT 1.4 (H) 02/21/2019    The 10-year ASCVD risk score Denman George DC Jr., et al., 2013) is: 1%   Values used to calculate the score:     Age: 19 years     Sex: Female     Is Non-Hispanic African American: Yes     Diabetic: No     Tobacco smoker: No     Systolic Blood Pressure: 138 mmHg     Is BP treated: Yes     HDL Cholesterol: 85 mg/dL     Total Cholesterol: 224 mg/dL   History There are no problems to display for this patient.  Past Medical History:  Diagnosis Date  . Anxiety   . Hypertension    Past Surgical History:  Procedure Laterality Date  . LAPAROSCOPIC GASTRIC BANDING     Allergies  Allergen Reactions  . Ace Inhibitors     Throat closes and lips swell  . Shellfish Allergy    Prior to Admission medications   Medication Sig Start Date End Date Taking? Authorizing Provider  amLODipine (NORVASC) 5  MG tablet Take 1 tablet (5 mg total) by mouth daily. 06/25/19  Yes Shade Flood, MD  clonazePAM (KLONOPIN) 0.5 MG tablet TAKE 1/2 TO 1 TABLET BY MOUTH TWICE DAILY AS NEEDED 06/25/19  Yes Shade Flood, MD  hydrochlorothiazide (HYDRODIURIL) 25 MG tablet Take 1 tablet (25 mg total) by mouth daily. 06/25/19  Yes Shade Flood, MD  latanoprost (XALATAN) 0.005 % ophthalmic solution INT 1 GTT IN OU QD HS 08/01/17  Yes [provider]  Multiple Vitamin (MULTIVITAMIN) tablet Take 1 tablet by mouth daily.   Yes [provider]  sertraline (ZOLOFT) 25 MG tablet Take 1 tablet (25 mg total) by mouth daily. 06/25/19  Yes Shade Flood, MD   Social History   Socioeconomic History  . Marital status: Single    Spouse name: Not on file  . Number of children: Not on file  . Years of education: Not on file  . Highest education level: Not on file  Occupational History  . Not on file  Tobacco Use  . Smoking status: Never Smoker  . Smokeless tobacco: Never Used  Substance and Sexual Activity  . Alcohol use: Yes  . Drug use: No  . Sexual activity: Yes  Other Topics Concern  . Not on file  Social History Narrative  . Not on file   Social Determinants of Health   Financial Resource Strain:   . Difficulty of Paying Living Expenses:   Food Insecurity:   . Worried About Programme researcher, broadcasting/film/video in the Last Year:   . Barista in the Last Year:   Transportation Needs:   . Freight forwarder (Medical):   Marland Kitchen Lack of Transportation (Non-Medical):   Physical Activity:   . Days of Exercise per Week:   . Minutes of Exercise per Session:   Stress:   . Feeling of Stress :   Social Connections:   . Frequency of Communication with Friends and Family:   . Frequency of Social Gatherings with Friends and Family:   . Attends Religious Services:   . Active Member of Clubs or Organizations:   . Attends Banker Meetings:   Marland Kitchen Marital Status:   Intimate Partner  Violence:   . Fear of Current or Ex-Partner:   . Emotionally Abused:   Marland Kitchen Physically  Abused:   . Sexually Abused:     Review of Systems  Constitutional: Negative for fatigue and unexpected weight change.  Respiratory: Negative for chest tightness and shortness of breath.   Cardiovascular: Negative for chest pain, palpitations and leg swelling.  Gastrointestinal: Negative for abdominal pain and blood in stool.  Neurological: Negative for dizziness, syncope, light-headedness and headaches.  Psychiatric/Behavioral: The patient is nervous/anxious.      Objective:   Vitals:   07/23/19 1056 07/23/19 1100 07/23/19 1125  BP: (!) 160/117 (!) 168/102 (!) 138/92  Pulse: (!) 145    Temp: 98 F (36.7 C)    TempSrc: Temporal    SpO2: 100%    Weight: 225 lb (102.1 kg)    Height: 5\' 6"  (1.676 m)       Physical Exam Vitals reviewed.  Constitutional:      Appearance: She is well-developed.  HENT:     Head: Normocephalic and atraumatic.  Eyes:     Conjunctiva/sclera: Conjunctivae normal.     Pupils: Pupils are equal, round, and reactive to light.  Neck:     Vascular: No carotid bruit.  Cardiovascular:     Rate and Rhythm: Normal rate and regular rhythm.     Heart sounds: Normal heart sounds.  Pulmonary:     Effort: Pulmonary effort is normal.     Breath sounds: Normal breath sounds.  Abdominal:     Palpations: Abdomen is soft. There is no pulsatile mass.     Tenderness: There is no abdominal tenderness.  Skin:    General: Skin is warm and dry.  Neurological:     Mental Status: She is alert and oriented to person, place, and time.  Psychiatric:        Attention and Perception: Attention and perception normal.        Mood and Affect: Affect normal. Mood is anxious.        Speech: Speech normal.        Behavior: Behavior normal.        Assessment & Plan:  Sandy [name] is a 44 y.o. female . Essential hypertension  - improved on repeat, suspect white coat component.   Option of higher dose amlodipine at 10 mg if persistent elevated home readings.  Anxiety state - Plan: busPIRone (BUSPAR) 5 MG tablet PTSD (post-traumatic stress disorder) - Plan: busPIRone (BUSPAR) 5 MG tablet  -Did not tolerate SSRI, will try BuSpar.  Lowest dose initially with option of increase.  Has Klonopin if needed.  Recent start of counseling, anticipate that will help as I do suspect a component of PTSD.  Hypoglycemia - Plan: Basic metabolic panel  -Single episode last year, repeat BMP.  Option of home monitoring when she does have panic symptoms in the morning that have improved with eating.  If low readings at home, would look into secondary causes including insulinoma.   Meds ordered this encounter  Medications  . busPIRone (BUSPAR) 5 MG tablet    Sig: Take 1 tablet (5 mg total) by mouth 2 (two) times daily.    Dispense:  60 tablet    Refill:  1   Patient Instructions    Keep a record of your blood pressures outside of the office daily. If reading over 140/90 - increase amlodipine to total 10mg  and let me know so I can send in a new prescription.   Try buspar twice per day for anxiety, but counsleing should help. I do think there is a component of PTSD.  Let me know how BuSpar works and we can refill medicines until next visit or increase dose if needed.  I will check blood sugar on electrolytes today, but if you have access to a blood sugar meter, let me know what those readings are when you are having panic attack symptoms in the morning.  Recheck 3 months Return to the clinic or go to the nearest emergency room if any of your symptoms worsen or new symptoms occur.    If you have lab work done today you will be contacted with your lab results within the next 2 weeks.  If you have not heard from [place] then please contact us. The fastest way to get your results is to register for My Chart.   IF you received an x-ray today, you will receive an invoice from [facility]  Radiology. Please contact [facility] at [phone] with questions or concerns regarding your invoice.   IF you received labwork today, you will receive an invoice from [place]. Please contact [facility] at [phone] with questions or concerns regarding your invoice.   Our billing staff will not be able to assist you with questions regarding bills from these companies.  You will be contacted with the lab results as soon as they are available. The fastest way to get your results is to activate your My Chart account. Instructions are located on the last page of this paperwork. If you have not heard from [place] regarding the results in 2 weeks, please contact this office.      Signed, Meredith Staggers, MD [facility]

## 2019-07-24 LAB — BASIC METABOLIC PANEL
BUN/Creatinine Ratio: 8 — ABNORMAL LOW (ref 9–23)
BUN: 6 mg/dL (ref 6–24)
CO2: 23 mmol/L (ref 20–29)
Calcium: 9.7 mg/dL (ref 8.7–10.2)
Chloride: 102 mmol/L (ref 96–106)
Creatinine, Ser: 0.74 mg/dL (ref 0.57–1.00)
GFR calc Af Amer: 116 mL/min/{1.73_m2} (ref >59)
GFR calc non Af Amer: 101 mL/min/{1.73_m2} (ref >59)
Glucose: 99 mg/dL (ref 65–99)
Potassium: 3.7 mmol/L (ref 3.5–5.2)
Sodium: 139 mmol/L (ref 134–144)

## 2019-09-02 ENCOUNTER — Telehealth: Payer: BC Managed Care – PPO | Admitting: Physician Assistant

## 2019-09-02 DIAGNOSIS — H1030 Unspecified acute conjunctivitis, unspecified eye: Secondary | ICD-10-CM

## 2019-09-02 MED ORDER — POLYMYXIN B-TRIMETHOPRIM 10000-0.1 UNIT/ML-% OP SOLN: 1.0000 [drp] | Freq: Four times a day (QID) | OPHTHALMIC | 0 refills | Status: AC

## 2019-09-02 NOTE — Progress Notes (Signed)
E-Visit for Pink Eye   We are sorry that you are not feeling well.  Here is how we plan to help!  Based on what you have shared with me it looks like you have conjunctivitis.  Conjunctivitis is a common inflammatory or infectious condition of the eye that is often referred to as "pink eye".  In most cases it is contagious (viral or bacterial). However, not all conjunctivitis requires antibiotics (ex. Allergic).  We have made appropriate suggestions for you based upon your presentation.  I have prescribed Polytrim Ophthalmic drops 1-2 drops 4 times a day times 5 days  Pink eye can be highly contagious.  It is typically spread through direct contact with secretions, or contaminated objects or surfaces that one may have touched.  Strict handwashing is suggested with soap and water is urged.  If not available, use alcohol based had sanitizer.  Avoid unnecessary touching of the eye.  If you wear contact lenses, you will need to refrain from wearing them until you see no white discharge from the eye for at least 24 hours after being on medication.  You should see symptom improvement in 1-2 days after starting the medication regimen.  Call us if symptoms are not improved in 1-2 days.  Home Care:  Wash your hands often!  Do not wear your contacts until you complete your treatment plan.  Avoid sharing towels, bed linen, personal items with a person who has pink eye.  See attention for anyone in your home with similar symptoms.  Get Help Right Away If:  Your symptoms do not improve.  You develop blurred or loss of vision.  Your symptoms worsen (increased discharge, pain or redness)  Your e-visit answers were reviewed by a board certified advanced clinical practitioner to complete your personal care plan.  Depending on the condition, your plan could have included both over the counter or prescription medications.  If there is a problem please reply  once you have received a response from your  provider.  Your safety is important to us.  If you have drug allergies check your prescription carefully.    You can use MyChart to ask questions about today's visit, request a non-urgent call back, or ask for a work or school excuse for 24 hours related to this e-Visit. If it has been greater than 24 hours you will need to follow up with your provider, or enter a new e-Visit to address those concerns.   You will get an e-mail in the next two days asking about your experience.  I hope that your e-visit has been valuable and will speed your recovery. Thank you for using e-visits.     Greater than 5 minutes, yet less than 10 minutes of time have been spent researching, coordinating and implementing care for this patient today.   

## 2019-09-20 ENCOUNTER — Other Ambulatory Visit: Payer: Self-pay | Admitting: Family Medicine

## 2019-10-05 ENCOUNTER — Telehealth: Payer: BC Managed Care – PPO | Admitting: Physician Assistant

## 2019-10-05 DIAGNOSIS — J019 Acute sinusitis, unspecified: Secondary | ICD-10-CM | POA: Diagnosis not present

## 2019-10-05 MED ORDER — DOXYCYCLINE HYCLATE 100 MG PO TABS: 100.0000 mg | ORAL_TABLET | Freq: Two times a day (BID) | ORAL | 0 refills | Status: DC

## 2019-10-05 NOTE — Progress Notes (Signed)

## 2019-10-29 ENCOUNTER — Ambulatory Visit: Payer: BC Managed Care – PPO | Admitting: Family Medicine

## 2019-11-10 ENCOUNTER — Other Ambulatory Visit: Payer: Self-pay

## 2019-11-10 ENCOUNTER — Encounter: Payer: Self-pay | Admitting: Family Medicine

## 2019-11-10 ENCOUNTER — Ambulatory Visit (INDEPENDENT_AMBULATORY_CARE_PROVIDER_SITE_OTHER): Payer: BC Managed Care – PPO | Admitting: Family Medicine

## 2019-11-10 VITALS — BP 130/90 | HR 114 | Temp 98.3°F | Ht 65.0 in | Wt 232.0 lb

## 2019-11-10 DIAGNOSIS — F411 Generalized anxiety disorder: Secondary | ICD-10-CM

## 2019-11-10 DIAGNOSIS — F418 Other specified anxiety disorders: Secondary | ICD-10-CM

## 2019-11-10 DIAGNOSIS — Z7185 Encounter for immunization safety counseling: Secondary | ICD-10-CM

## 2019-11-10 DIAGNOSIS — Z7189 Other specified counseling: Secondary | ICD-10-CM | POA: Diagnosis not present

## 2019-11-10 DIAGNOSIS — I10 Essential (primary) hypertension: Secondary | ICD-10-CM

## 2019-11-10 MED ORDER — AMLODIPINE BESYLATE 5 MG PO TABS: 5.0000 mg | ORAL_TABLET | Freq: Every day | ORAL | 1 refills | Status: AC

## 2019-11-10 MED ORDER — HYDROCHLOROTHIAZIDE 25 MG PO TABS: 25.0000 mg | ORAL_TABLET | Freq: Every day | ORAL | 1 refills | Status: DC

## 2019-11-10 MED ORDER — CLONAZEPAM 0.5 MG PO TABS: ORAL_TABLET | ORAL | 0 refills | Status: AC

## 2019-11-10 NOTE — Patient Instructions (Addendum)
  Keep a record of your blood pressures outside of the office with an upper arm cuff ideally and if over 140/90 - increase to 10 mg amlodipine per day.   Can stop buspirone if not tolerated. I do recommend meeting with new counselor if unable to tolerate meds.   Here are a few other options for counseling:  [facility]:  [phone]  [facility] [phone]  I do recommend covid vaccine as soon as possible. Let me know if there are questions.   If you have lab work done today you will be contacted with your lab results within the next 2 weeks.  If you have not heard from [place] then please contact us. The fastest way to get your results is to register for My Chart.   IF you received an x-ray today, you will receive an invoice from [facility]. Please contact [facility]iology at [phone] with questions or concerns regarding your invoice.   IF you received labwork today, you will receive an invoice from [place]. Please contact [facility] at [phone] with questions or concerns regarding your invoice.   Our billing staff will not be able to assist you with questions regarding bills from these companies.  You will be contacted with the lab results as soon as they are available. The fastest way to get your results is to activate your My Chart account. Instructions are located on the last page of this paperwork. If you have not heard from [place] regarding the results in 2 weeks, please contact this office.

## 2019-11-10 NOTE — Progress Notes (Signed)
Subjective:  Patient ID: Sandy [name], female    DOB: 1976/03/14  Age: 44 y.o. MRN: [id number]  CC:  Chief Complaint  Patient presents with  . Hypertension    3 m f/u   . med review    HPI Sandy [name] presents for   Hypertension: Amlodipine 5 mg daily, hydrochlorothiazide 25 mg daily.  Option of 10 mg amlodipine dosing discussed last visit if elevated readings outside of office. Home readings:114-127/75-82 on [employer] watch.   BP Readings from Last 3 Encounters:  11/10/19 (!) 130/90  07/23/19 (!) 138/92  06/25/19 (!) 170/108   Lab Results  Component Value Date   CREATININE 0.74 07/23/2019    Anxiety/PTSD: Last discussed in April.  Did not tolerate SSRI.  Started on BuSpar 5 mg, with option of increased dosing.  Klonopin if needed.  Had recently started counseling at that time. Took 5mg  buspirone BID for 1 month. Felt a little shaky  did not feel liek self. Stopped that med.  Taking klonopin 1/2 about once per week, some weeks not at all.  Not meeting with counseling currently. Not the right fit with prior counselor - had 4 visits.  Teaching and counseling for work.  Controlled substance database (PDMP) reviewed. No concerns appreciated. Klonopin last refilled in March.     GAD 7 : Generalized Anxiety Score 11/10/2019 07/23/2019 06/25/2019 03/26/2019  Nervous, Anxious, on Edge 1 1 3 2   Control/stop worrying 1 1 2 2   Worry too much - different things 1 1 2 3   Trouble relaxing 0 2 1 2   Restless 1 1 0 1  Easily annoyed or irritable 0 2 1 2   Afraid - awful might happen 0 1 1 1   Total GAD 7 Score 4 9 10 13   Anxiety Difficulty Not difficult at all - - -   Depression screen [facility] 2/9 11/10/2019 07/23/2019 06/25/2019 03/26/2019 02/21/2019  Decreased Interest 0 0 0 2 2  Down, Depressed, Hopeless 0 0 0 2 2  PHQ - 2 Score 0 0 0 4 4  Altered sleeping - - - 2 0  Tired, decreased energy - - - 1 1  Change in appetite - - - 1 1  Feeling bad or failure about yourself  - - - 2 0    Trouble concentrating - - - 1 0  Moving slowly or fidgety/restless - - - 1 0  Suicidal thoughts - - - 0 0  PHQ-9 Score - - - 12 6     History There are no problems to display for this patient.  Past Medical History:  Diagnosis Date  . Anxiety   . Hypertension    Past Surgical History:  Procedure Laterality Date  . LAPAROSCOPIC GASTRIC BANDING     Allergies  Allergen Reactions  . Ace Inhibitors     Throat closes and lips swell  . Shellfish Allergy    Prior to Admission medications   Medication Sig Start Date End Date Taking? Authorizing Provider  amLODipine (NORVASC) 5 MG tablet Take 1 tablet (5 mg total) by mouth daily. 06/25/19  Yes [facility], MD  clonazePAM (KLONOPIN) 0.5 MG tablet TAKE 1/2 TO 1 TABLET BY MOUTH TWICE DAILY AS NEEDED 06/25/19  Yes 11/12/2019, MD  hydrochlorothiazide (HYDRODIURIL) 25 MG tablet Take 1 tablet (25 mg total) by mouth daily. 06/25/19  Yes 08/25/2019, MD  latanoprost (XALATAN) 0.005 % ophthalmic solution INT 1 GTT IN OU QD HS 08/01/17  Yes [provider]  Multiple Vitamin (MULTIVITAMIN) tablet Take 1 tablet by mouth daily.   Yes [provider]  busPIRone (BUSPAR) 5 MG tablet Take 1 tablet (5 mg total) by mouth 2 (two) times daily. Patient not taking: Reported on 11/10/2019 07/23/19   Shade Flood, MD   Social History   Socioeconomic History  . Marital status: Single    Spouse name: Not on file  . Number of children: Not on file  . Years of education: Not on file  . Highest education level: Not on file  Occupational History  . Not on file  Tobacco Use  . Smoking status: Never Smoker  . Smokeless tobacco: Never Used  Substance and Sexual Activity  . Alcohol use: Yes  . Drug use: No  . Sexual activity: Yes  Other Topics Concern  . Not on file  Social History Narrative  . Not on file   Social Determinants of Health   Financial Resource Strain:   . Difficulty of Paying Living Expenses:   Food  Insecurity:   . Worried About Programme researcher, broadcasting/film/video in the Last Year:   . Barista in the Last Year:   Transportation Needs:   . Freight forwarder (Medical):   Marland Kitchen Lack of Transportation (Non-Medical):   Physical Activity:   . Days of Exercise per Week:   . Minutes of Exercise per Session:   Stress:   . Feeling of Stress :   Social Connections:   . Frequency of Communication with Friends and Family:   . Frequency of Social Gatherings with Friends and Family:   . Attends Religious Services:   . Active Member of Clubs or Organizations:   . Attends Banker Meetings:   Marland Kitchen Marital Status:   Intimate Partner Violence:   . Fear of Current or Ex-Partner:   . Emotionally Abused:   Marland Kitchen Physically Abused:   . Sexually Abused:     Review of Systems   Objective:   Vitals:   11/10/19 0844 11/10/19 0904 11/10/19 0938  BP: (!) 160/105 (!) 144/78 (!) 130/90  Pulse: (!) 114    Temp: 98.3 F (36.8 C)    TempSrc: Temporal    SpO2: 100%    Weight: (!) 232 lb (105.2 kg)    Height: 5\' 5"  (1.651 m)       Physical Exam Vitals reviewed.  Constitutional:      Appearance: She is well-developed.  HENT:     Head: Normocephalic and atraumatic.  Eyes:     Conjunctiva/sclera: Conjunctivae normal.     Pupils: Pupils are equal, round, and reactive to light.  Neck:     Vascular: No carotid bruit.  Cardiovascular:     Rate and Rhythm: Normal rate and regular rhythm.     Heart sounds: Normal heart sounds.  Pulmonary:     Effort: Pulmonary effort is normal.     Breath sounds: Normal breath sounds.  Abdominal:     Palpations: Abdomen is soft. There is no pulsatile mass.     Tenderness: There is no abdominal tenderness.  Skin:    General: Skin is warm and dry.  Neurological:     Mental Status: She is alert and oriented to person, place, and time.  Psychiatric:        Mood and Affect: Mood normal.        Behavior: Behavior normal.        Thought Content: Thought content  normal.    Assessment &  Plan:  Sandy [name] is a 44 y.o. female . Anxiety state Situational anxiety - Plan: clonazePAM (KLONOPIN) 0.5 MG tablet -Unfortunately has not tolerated SSRI or buspirone.  Stressed importance of counseling.  We will hold on new medications for now.  Clonazepam refilled for as needed use.  Essential hypertension - Plan: amLODipine (NORVASC) 5 MG tablet, hydrochlorothiazide (HYDRODIURIL) 25 MG tablet  -Borderline but improved on repeat testing.  Recommended blood pressure cuff as opposed to BP monitoring on her watch.  Will remain on same dose of HCTZ and amlodipine.  Plan on lab work next visit.  Vaccine counseling  -COVID-19 vaccine recommended, attempted to identify barriers.  She does plan on getting that vaccine.  Recommended to do so as soon as possible.   Meds ordered this encounter  Medications  . amLODipine (NORVASC) 5 MG tablet    Sig: Take 1 tablet (5 mg total) by mouth daily.    Dispense:  90 tablet    Refill:  1  . hydrochlorothiazide (HYDRODIURIL) 25 MG tablet    Sig: Take 1 tablet (25 mg total) by mouth daily.    Dispense:  90 tablet    Refill:  1  . clonazePAM (KLONOPIN) 0.5 MG tablet    Sig: TAKE 1/2 TO 1 TABLET BY MOUTH TWICE DAILY AS NEEDED    Dispense:  30 tablet    Refill:  0   Patient Instructions    Keep a record of your blood pressures outside of the office with an upper arm cuff ideally and if over 140/90 - increase to 10 mg amlodipine per day.   Can stop buspirone if not tolerated. I do recommend meeting with new counselor if unable to tolerate meds.   Here are a few other options for counseling:  [facility]:  [phone]  [facility] [phone]  I do recommend covid vaccine as soon as possible. Let me know if there are questions.   If you have lab work done today you will be contacted with your lab results within the next 2 weeks.  If you have not heard from [place] then please  contact us. The fastest way to get your results is to register for My Chart.   IF you received an x-ray today, you will receive an invoice from [facility]. Please contact [facility] at [phone] with questions or concerns regarding your invoice.   IF you received labwork today, you will receive an invoice from [place]. Please contact [facility] at [phone] with questions or concerns regarding your invoice.   Our billing staff will not be able to assist you with questions regarding bills from these companies.  You will be contacted with the lab results as soon as they are available. The fastest way to get your results is to activate your My Chart account. Instructions are located on the last page of this paperwork. If you have not heard from [place] regarding the results in 2 weeks, please contact this office.          Signed, Meredith Staggers, MD [facility]

## 2019-12-12 ENCOUNTER — Other Ambulatory Visit: Payer: Self-pay | Admitting: Family Medicine

## 2019-12-12 DIAGNOSIS — F411 Generalized anxiety disorder: Secondary | ICD-10-CM

## 2019-12-12 DIAGNOSIS — F4310 Post-traumatic stress disorder, unspecified: Secondary | ICD-10-CM

## 2019-12-29 ENCOUNTER — Telehealth: Payer: BC Managed Care – PPO | Admitting: Emergency Medicine

## 2019-12-29 DIAGNOSIS — J069 Acute upper respiratory infection, unspecified: Secondary | ICD-10-CM | POA: Diagnosis not present

## 2019-12-29 MED ORDER — METHYLPREDNISOLONE 4 MG PO TBPK: ORAL_TABLET | ORAL | 0 refills | Status: AC

## 2019-12-29 MED ORDER — FLUTICASONE PROPIONATE 50 MCG/ACT NA SUSP: 2.0000 | Freq: Every day | NASAL | 6 refills | Status: AC

## 2019-12-29 MED ORDER — BENZONATATE 100 MG PO CAPS: 100.0000 mg | ORAL_CAPSULE | Freq: Three times a day (TID) | ORAL | 0 refills | Status: AC | PRN

## 2019-12-29 NOTE — Progress Notes (Signed)
EVEN WITH VACCINATION YOU CAN STILL GET CORONAVIRUS AND YOU SHOULD BE TESTED IMMEDIATLY.  We are sorry you are not feeling well.  Here is how we plan to help!  Based on what you have shared with me, it looks like you may have a viral upper respiratory infection.  Upper respiratory infections are caused by a large number of viruses; however, rhinovirus is the most common cause.   Symptoms vary from person to person, with common symptoms including sore throat, cough, fatigue or lack of energy and feeling of general discomfort.  A low-grade fever of up to 100.4 may present, but is often uncommon.  Symptoms vary however, and are closely related to a person's age or underlying illnesses.  The most common symptoms associated with an upper respiratory infection are nasal discharge or congestion, cough, sneezing, headache and pressure in the ears and face.  These symptoms usually persist for about 3 to 10 days, but can last up to 2 weeks.  It is important to know that upper respiratory infections do not cause serious illness or complications in most cases.    Upper respiratory infections can be transmitted from person to person, with the most common method of transmission being a person's hands.  The virus is able to live on the skin and can infect other persons for up to 2 hours after direct contact.  Also, these can be transmitted when someone coughs or sneezes; thus, it is important to cover the mouth to reduce this risk.  To keep the spread of the illness at bay, good hand hygiene is very important.  This is an infection that is most likely caused by a virus. There are no specific treatments other than to help you with the symptoms until the infection runs its course.  We are sorry you are not feeling well.  Here is how we plan to help!  According to the current IDSA guidelines, you do not meet criteria for bacterial infection requiring antibiotic therapy as treatment. Those guidelines are as follows:  1)  Persistent symptoms or signs of acute rhinosinusitis lasting 10 days or more without clinical improvement  2) Severe symptoms or signs of high fever (102F [39C] or higher) AND purulent nasal discharge AND facial pain for three days or more at the beginning of illness  3) Worsening symptoms or signs (i.e., fever, headache, or increase in nasal discharge) that lasted five to six days, got better, and then worsened (double-sickening)     For nasal congestion, you may use an oral decongestants such as Mucinex D or if you have glaucoma or high blood pressure use plain Mucinex.  Saline nasal spray or nasal drops can help and can safely be used as often as needed for congestion.  For your congestion, I have prescribed Fluticasone nasal spray one spray in each nostril twice a day  If you do not have a history of heart disease, hypertension, diabetes or thyroid disease, prostate/bladder issues or glaucoma, you may also use Sudafed to treat nasal congestion.  It is highly recommended that you consult with a pharmacist or your primary care physician to ensure this medication is safe for you to take.     If you have a cough, you may use cough suppressants such as Delsym and Robitussin.  If you have glaucoma or high blood pressure, you can also use Coricidin HBP.   For cough I have prescribed for you A prescription cough medication called Tessalon Perles 100 mg. You may take 1-2  capsules every 8 hours as needed for cough and a steroid taper, use as directed.  If you have a sore or scratchy throat, use a saltwater gargle-  to  teaspoon of salt dissolved in a 4-ounce to 8-ounce glass of warm water.  Gargle the solution for approximately 15-30 seconds and then spit.  It is important not to swallow the solution.  You can also use throat lozenges/cough drops and Chloraseptic spray to help with throat pain or discomfort.  Warm or cold liquids can also be helpful in relieving throat pain.  For headache, pain or  general discomfort, you can use Ibuprofen or Tylenol as directed.   Some authorities believe that zinc sprays or the use of Echinacea may shorten the course of your symptoms.   HOME CARE . Only take medications as instructed by your medical team. . Be sure to drink plenty of fluids. Water is fine as well as fruit juices, sodas and electrolyte beverages. You may want to stay away from caffeine or alcohol. If you are nauseated, try taking small sips of liquids. How do you know if you are getting enough fluid? Your urine should be a pale yellow or almost colorless. . Get rest. . Taking a steamy shower or using a humidifier may help nasal congestion and ease sore throat pain. You can place a towel over your head and breathe in the steam from hot water coming from a faucet. . Using a saline nasal spray works much the same way. . Cough drops, hard candies and sore throat lozenges may ease your cough. . Avoid close contacts especially the very young and the elderly . Cover your mouth if you cough or sneeze . Always remember to wash your hands.   GET HELP RIGHT AWAY IF: . You develop worsening fever. . If your symptoms do not improve within 10 days . You develop yellow or green discharge from your nose over 3 days. . You have coughing fits . You develop a severe head ache or visual changes. . You develop shortness of breath, difficulty breathing or start having chest pain . Your symptoms persist after you have completed your treatment plan  MAKE SURE YOU   Understand these instructions.  Will watch your condition.  Will get help right away if you are not doing well or get worse.  Your e-visit answers were reviewed by a board certified advanced clinical practitioner to complete your personal care plan. Depending upon the condition, your plan could have included both over the counter or prescription medications. Please review your pharmacy choice. If there is a problem, you may call our  nursing hot line at and have the prescription routed to another pharmacy. Your safety is important to [place]. If you have drug allergies check your prescription carefully.   You can use MyChart to ask questions about today's visit, request a non-urgent call back, or ask for a work or school excuse for 24 hours related to this e-Visit. If it has been greater than 24 hours you will need to follow up with your provider, or enter a new e-Visit to address those concerns. You will get an e-mail in the next two days asking about your experience.  I hope that your e-visit has been valuable and will speed your recovery. Thank you for using e-visits.     **Please do not respond to this message unless you have follow up questions.** Greater than 5 but less than 10 minutes spent researching, coordinating, and implementing care for  this patient today

## 2020-01-20 ENCOUNTER — Telehealth: Payer: Self-pay | Admitting: Family Medicine

## 2020-01-20 NOTE — Telephone Encounter (Signed)
Called pt in error( twice ) was going to lvm letting her know she was called in error mail box is full .

## 2020-01-26 ENCOUNTER — Ambulatory Visit: Payer: BC Managed Care – PPO | Admitting: Family Medicine

## 2020-02-02 ENCOUNTER — Telehealth: Payer: BC Managed Care – PPO | Admitting: Emergency Medicine

## 2020-02-02 DIAGNOSIS — L03313 Cellulitis of chest wall: Secondary | ICD-10-CM

## 2020-02-02 MED ORDER — DOXYCYCLINE HYCLATE 100 MG PO CAPS: 100.0000 mg | ORAL_CAPSULE | Freq: Two times a day (BID) | ORAL | 0 refills | Status: AC

## 2020-02-02 NOTE — Progress Notes (Signed)
E Visit for Cellulitis  We are sorry that you are not feeling well. Here is how we plan to help!  Based on what you shared with me it looks like you have cellulitis.  Cellulitis looks like areas of skin redness, swelling, and warmth; it develops as a result of bacteria entering under the skin. Little red spots and/or bleeding can be seen in skin, and tiny surface sacs containing fluid can occur. Fever can be present. Cellulitis is almost always on one side of a body, and the lower limbs are the most common site of involvement.   I have prescribed: Doxycycline 100mg  2x daily for 10 days.  HOME CARE:  . Take your medications as ordered and take all of them, even if the skin irritation appears to be healing.   GET HELP RIGHT AWAY IF:  . Symptoms that don't begin to go away within 48 hours. . Severe redness persists or worsens . If the area turns color, spreads or swells. . If it blisters and opens, develops yellow-brown crust or bleeds. . You develop a fever or chills. . If the pain increases or becomes unbearable.  . Are unable to keep fluids and food down.  MAKE SURE YOU    Understand these instructions.  Will watch your condition.  Will get help right away if you are not doing well or get worse.  Thank you for choosing an e-visit. Your e-visit answers were reviewed by a board certified advanced clinical practitioner to complete your personal care plan. Depending upon the condition, your plan could have included both over the counter or prescription medications. Please review your pharmacy choice. Make sure the pharmacy is open so you can pick up prescription now. If there is a problem, you may contact your provider through and have the prescription routed to another pharmacy. Your safety is important to [employer]. If you have drug allergies check your prescription carefully.  For the next 24 hours you can use MyChart to ask questions about today's visit, request a  non-urgent call back, or ask for a work or school excuse. You will get an email in the next two days asking about your experience. I hope that your e-visit has been valuable and will speed your recovery.  Approximately 5 minutes was used in reviewing the patient's chart, questionnaire, prescribing medications, and documentation.

## 2020-02-23 ENCOUNTER — Ambulatory Visit: Payer: BC Managed Care – PPO | Admitting: Family Medicine

## 2020-05-10 ENCOUNTER — Other Ambulatory Visit: Payer: Self-pay | Admitting: Family Medicine

## 2020-05-10 DIAGNOSIS — I10 Essential (primary) hypertension: Secondary | ICD-10-CM

## 2020-05-10 NOTE — Telephone Encounter (Signed)
Requested medication (s) are due for refill today: no  Requested medication (s) are on the active medication list: yes  Notes to clinic:  overdue for follow up appt Requested Prescriptions  Pending Prescriptions Disp Refills   hydrochlorothiazide (HYDRODIURIL) 25 MG tablet [Pharmacy Med Name: HYDROCHLOROTHIAZIDE 25MG  TABLETS] 90 tablet 1    Sig: TAKE 1 TABLET(25 MG) BY MOUTH DAILY      Cardiovascular: Diuretics - Thiazide Failed - 05/10/2020  6:13 AM      Failed - Last BP in normal range    BP Readings from Last 1 Encounters:  11/10/19 (!) 130/90          Failed - Valid encounter within last 6 months    Recent Outpatient Visits           6 months ago Anxiety state   [facility], Sunday Shams, MD   9 months ago Essential hypertension   [facility], Sunday Shams, MD   10 months ago Essential hypertension   [facility], Sunday Shams, MD   1 year ago Essential hypertension   [facility], Sunday Shams, MD   1 year ago Essential hypertension   [facility], Sunday Shams, MD                Passed - Ca in normal range and within 360 days    Calcium  Date Value Ref Range Status  07/23/2019 9.7 8.7 - 10.2 mg/dL Final          Passed - Cr in normal range and within 360 days    Creat  Date Value Ref Range Status  03/08/2015 0.66 0.50 - 1.10 mg/dL Final   Creatinine, Ser  Date Value Ref Range Status  07/23/2019 0.74 0.57 - 1.00 mg/dL Final          Passed - K in normal range and within 360 days    Potassium  Date Value Ref Range Status  07/23/2019 3.7 3.5 - 5.2 mmol/L Final          Passed - Na in normal range and within 360 days    Sodium  Date Value Ref Range Status  07/23/2019 139 134 - 144 mmol/L Final

## 2020-09-21 ENCOUNTER — Other Ambulatory Visit: Payer: Self-pay | Admitting: Family Medicine

## 2020-09-21 DIAGNOSIS — I10 Essential (primary) hypertension: Secondary | ICD-10-CM
